# Patient Record
Sex: Male | Born: 1970 | Hispanic: Yes | Smoking: Former smoker
Health system: Southern US, Community
[De-identification: ages and names within clinical notes are randomized; demographics above are authoritative.]

## PROBLEM LIST (undated history)

## (undated) DIAGNOSIS — I10 Essential (primary) hypertension: Secondary | ICD-10-CM

## (undated) DIAGNOSIS — G43909 Migraine, unspecified, not intractable, without status migrainosus: Secondary | ICD-10-CM

## (undated) DIAGNOSIS — E7800 Pure hypercholesterolemia, unspecified: Secondary | ICD-10-CM

## (undated) DIAGNOSIS — E119 Type 2 diabetes mellitus without complications: Secondary | ICD-10-CM

## (undated) HISTORY — PX: APPENDECTOMY: SHX54

---

## 1988-07-26 HISTORY — PX: OTHER SURGICAL HISTORY: SHX169

## 1998-07-26 HISTORY — PX: OTHER SURGICAL HISTORY: SHX169

## 2019-01-02 ENCOUNTER — Other Ambulatory Visit: Payer: Self-pay

## 2019-01-02 ENCOUNTER — Emergency Department (HOSPITAL_COMMUNITY)
Admission: EM | Admit: 2019-01-02 | Discharge: 2019-01-02 | Disposition: A | Discharge status: Home / Self Care | Payer: Medicaid Other | Attending: Emergency Medicine | Admitting: Emergency Medicine

## 2019-01-02 ENCOUNTER — Emergency Department (HOSPITAL_COMMUNITY): Payer: Medicaid Other

## 2019-01-02 DIAGNOSIS — M25571 Pain in right ankle and joints of right foot: Secondary | ICD-10-CM | POA: Diagnosis not present

## 2019-01-02 DIAGNOSIS — Y939 Activity, unspecified: Secondary | ICD-10-CM | POA: Diagnosis not present

## 2019-01-02 DIAGNOSIS — S0990XA Unspecified injury of head, initial encounter: Secondary | ICD-10-CM | POA: Diagnosis not present

## 2019-01-02 DIAGNOSIS — W1830XA Fall on same level, unspecified, initial encounter: Secondary | ICD-10-CM | POA: Diagnosis not present

## 2019-01-02 DIAGNOSIS — Y999 Unspecified external cause status: Secondary | ICD-10-CM | POA: Diagnosis not present

## 2019-01-02 DIAGNOSIS — R51 Headache: Secondary | ICD-10-CM | POA: Diagnosis present

## 2019-01-02 DIAGNOSIS — R42 Dizziness and giddiness: Secondary | ICD-10-CM | POA: Diagnosis not present

## 2019-01-02 DIAGNOSIS — Y92009 Unspecified place in unspecified non-institutional (private) residence as the place of occurrence of the external cause: Secondary | ICD-10-CM | POA: Diagnosis not present

## 2019-01-02 LAB — BASIC METABOLIC PANEL
Anion gap: 8 (ref 5–15)
BUN: 22 mg/dL — ABNORMAL HIGH (ref 6–20)
CO2: 22 mmol/L (ref 22–32)
Calcium: 9.5 mg/dL (ref 8.9–10.3)
Chloride: 108 mmol/L (ref 98–111)
Creatinine, Ser: 0.9 mg/dL (ref 0.61–1.24)
GFR calc Af Amer: >60 mL/min (ref >60)
GFR calc non Af Amer: >60 mL/min (ref >60)
Glucose, Bld: 173 mg/dL — ABNORMAL HIGH (ref 70–99)
Potassium: 4.1 mmol/L (ref 3.5–5.1)
Sodium: 138 mmol/L (ref 135–145)

## 2019-01-02 LAB — CBC
HCT: 42.1 % (ref 39.0–52.0)
Hemoglobin: 13.8 g/dL (ref 13.0–17.0)
MCH: 29.6 pg (ref 26.0–34.0)
MCHC: 32.8 g/dL (ref 30.0–36.0)
MCV: 90.1 fL (ref 80.0–100.0)
Platelets: 264 10*3/uL (ref 150–400)
RBC: 4.67 MIL/uL (ref 4.22–5.81)
RDW: 13.3 % (ref 11.5–15.5)
WBC: 7.6 10*3/uL (ref 4.0–10.5)
nRBC: 0 % (ref 0.0–0.2)

## 2019-01-02 MED ORDER — ACETAMINOPHEN 500 MG PO TABS
1000.0000 mg | ORAL_TABLET | Freq: Once | ORAL | Status: AC
  Administered 2019-01-02: 1000 mg via ORAL
  Filled 2019-01-02: qty 2

## 2019-01-02 MED ORDER — SODIUM CHLORIDE 0.9% FLUSH: 3.0000 mL | Freq: Once | INTRAVENOUS | Status: DC

## 2019-01-02 NOTE — ED Triage Notes (Signed)
Pt endorses having mechanical fall last night and has had dizziness, headache and right ankle pain since then. Pt indention in head that is normal. VSS.

## 2019-01-02 NOTE — ED Notes (Signed)
Patient verbalizes understanding of discharge instructions. Opportunity for questioning and answers were provided. Armband removed by staff, pt discharged from ED ambulatory to home.  

## 2019-01-02 NOTE — ED Provider Notes (Signed)
[facility] Note   CSN: [id number] Arrival date & time: 01/02/19  1227    History   Chief Complaint Chief Complaint  Patient presents with  . Fall  . Ankle Pain  . Dizziness    HPI Jackolyn ConferJuan M Leisner is a 48 y.o. male.     HPI 48 year old male presents the emergency department complaints of headache and dizziness after a fall last night.  He reports tripping and falling while at home injuring his right ankle and striking his head.  He denies nausea or vomiting.  No weakness of his arms or legs.  No neck pain.  Denies chest pain or shortness of breath.  Reports pain with ambulation on his right lateral ankle.  Still able to walk.  Symptoms are moderate in severity.   No past medical history on file.  There are no active problems to display for this patient.   * The histories are not reviewed yet. Please review them in the "History" navigator section and refresh this SmartLink.      Home Medications    Prior to Admission medications   Not on File    Family History No family history on file.  Social History Social History   Tobacco Use  . Smoking status: Not on file  Substance Use Topics  . Alcohol use: Not on file  . Drug use: Not on file     Allergies   Patient has no known allergies.   Review of Systems Review of Systems  All other systems reviewed and are negative.    Physical Exam Updated Vital Signs BP (!) 125/94   Pulse (!) 58   Temp 98 F (36.7 C) (Oral)   Resp 14   SpO2 100%   Physical Exam Vitals signs and nursing note reviewed.  Constitutional:      Appearance: He is well-developed.  HENT:     Head: Normocephalic and atraumatic.  Neck:     Musculoskeletal: Normal range of motion.  Cardiovascular:     Rate and Rhythm: Normal rate and regular rhythm.     Heart sounds: Normal heart sounds.  Pulmonary:     Effort: Pulmonary effort is normal. No respiratory distress.  Abdominal:   General: There is no distension.     Palpations: Abdomen is soft.     Tenderness: There is no abdominal tenderness.  Musculoskeletal: Normal range of motion.     Comments: Mild pain with range of motion of his right ankle without obvious deformity.  Full range of motion of bilateral hips and bilateral knees.  Skin:    General: Skin is warm and dry.  Neurological:     Mental Status: He is alert and oriented to person, place, and time.  Psychiatric:        Judgment: Judgment normal.      ED Treatments / Results  Labs (all labs ordered are listed, but only abnormal results are displayed) Labs Reviewed  BASIC METABOLIC PANEL - Abnormal; Notable for the following components:      Result Value   Glucose, Bld 173 (*)    BUN 22 (*)    All other components within normal limits  CBC    EKG EKG Interpretation  Date/Time:  Tuesday January 02 2019 12:44:49 EDT Ventricular Rate:  73 PR Interval:  158 QRS Duration: 144 QT Interval:  398 QTC Calculation: 438 R Axis:   -90 Text Interpretation:  Normal sinus rhythm Right bundle branch block Abnormal ECG  No old tracing to compare Confirmed by Jola Schmidt [phone]) on 01/02/2019 2:41:11 PM   Radiology Dg Ankle Complete Right  Result Date: 01/02/2019 CLINICAL DATA:  Pain following fall EXAM: RIGHT ANKLE - COMPLETE 3+ VIEW COMPARISON:  None. FINDINGS: Frontal, oblique, and lateral views obtained. There is soft tissue swelling laterally. There is no demonstrable fracture or joint effusion. Joint spaces appear unremarkable. No erosive change. Ankle mortise appears intact. There is calcification posterior tibial artery. IMPRESSION: No fracture or arthropathy. Ankle mortise appears intact. There is posterior tibial artery atherosclerotic calcification. Electronically Signed   By: Lowella Grip III M.D.   On: 01/02/2019 13:40   Ct Head Wo Contrast  Result Date: 01/02/2019 CLINICAL DATA:  Fall last night.  Headache and dizziness EXAM: CT HEAD WITHOUT  CONTRAST TECHNIQUE: Contiguous axial images were obtained from the base of the skull through the vertex without intravenous contrast. COMPARISON:  None. FINDINGS: Brain: Mild atrophy for age. Negative for hydrocephalus. Negative for acute infarct, hemorrhage, or mass. Vascular: Negative for hyperdense vessel Skull: Considerable bony thickening in the frontal bone bilaterally anteriorly with associated loss of overlying soft tissues. This is most likely postsurgical. Sinuses/Orbits: Negative Other: None IMPRESSION: Atrophy for age.  No acute intracranial abnormality Presumed postsurgical changes anterior frontal bone and scalp. Electronically Signed   By: Franchot Gallo M.D.   On: 01/02/2019 14:17    Procedures Procedures (including critical care time)  Medications Ordered in ED Medications  sodium chloride flush (NS) 0.9 % injection 3 mL (3 mLs Intravenous Not Given 01/02/19 1312)  acetaminophen (TYLENOL) tablet 1,000 mg (1,000 mg Oral Given 01/02/19 1350)     Initial Impression / Assessment and Plan / ED Course  I have reviewed the triage vital signs and the nursing notes.  Pertinent labs & imaging results that were available during my care of the patient were reviewed by me and considered in my medical decision making (see chart for details).        Patient is overall well-appearing.  Imaging here in the emergency department without significant abnormality.  No traumatic findings.  Primary care follow-up.  Home with instructions for ibuprofen and Tylenol for symptom control.  Discharged home in good condition  Final Clinical Impressions(s) / ED Diagnoses   Final diagnoses:  Traumatic injury of head, initial encounter  Acute right ankle pain    ED Discharge Orders    None       Jola Schmidt, MD 01/06/19 1213

## 2019-01-02 NOTE — ED Notes (Signed)
Patient transported to X-ray 

## 2019-01-31 ENCOUNTER — Encounter (HOSPITAL_COMMUNITY): Payer: Self-pay | Admitting: Emergency Medicine

## 2019-01-31 ENCOUNTER — Emergency Department (HOSPITAL_COMMUNITY)
Admission: EM | Admit: 2019-01-31 | Discharge: 2019-01-31 | Disposition: A | Discharge status: Home / Self Care | Payer: Medicaid Other | Attending: Emergency Medicine | Admitting: Emergency Medicine

## 2019-01-31 ENCOUNTER — Other Ambulatory Visit: Payer: Self-pay

## 2019-01-31 DIAGNOSIS — L918 Other hypertrophic disorders of the skin: Secondary | ICD-10-CM | POA: Insufficient documentation

## 2019-01-31 DIAGNOSIS — R222 Localized swelling, mass and lump, trunk: Secondary | ICD-10-CM | POA: Diagnosis present

## 2019-01-31 NOTE — ED Notes (Signed)
Patient verbalizes understanding of discharge instructions . Opportunity for questions and answers were provided . Armband removed by staff ,Pt discharged from ED. W/C  offered at D/C  and Declined W/C at D/C and was escorted to lobby by RN.  

## 2019-01-31 NOTE — Discharge Instructions (Addendum)
Please read attached information. If you experience any new or worsening signs or symptoms please return to the emergency room for evaluation. Please follow-up with your primary care provider or specialist as discussed.  °

## 2019-01-31 NOTE — ED Provider Notes (Signed)
[place] EMERGENCY DEPARTMENT Provider Note   CSN: [id number] Arrival date & time: 01/31/19  1304    History   Chief Complaint Chief Complaint  Patient presents with  . Abscess    HPI Matthew Fowler is a 48 y.o. male.     HPI   48 year old male presents today with a bump to his perineum.  He notes 2 weeks of irritation.  Notes he tried Band-Aids that are supposed to help remove these.  He denies any fever.  No history of the same.  History reviewed. No pertinent past medical history.  There are no active problems to display for this patient.      Home Medications    Prior to Admission medications   Not on File    Family History No family history on file.  Social History Social History   Tobacco Use  . Smoking status: Not on file  Substance Use Topics  . Alcohol use: Not on file  . Drug use: Not on file     Allergies   Patient has no known allergies.   Review of Systems Review of Systems  All other systems reviewed and are negative.    Physical Exam Updated Vital Signs BP (!) 140/92 (BP Location: Right Arm)   Pulse 70   Temp 98.4 F (36.9 C) (Oral)   Resp 20   SpO2 100%   Physical Exam Vitals signs and nursing note reviewed.  Constitutional:      Appearance: He is well-developed.  HENT:     Head: Normocephalic and atraumatic.  Eyes:     General: No scleral icterus.       Right eye: No discharge.        Left eye: No discharge.     Conjunctiva/sclera: Conjunctivae normal.     Pupils: Pupils are equal, round, and reactive to light.  Neck:     Musculoskeletal: Normal range of motion.     Vascular: No JVD.     Trachea: No tracheal deviation.  Pulmonary:     Effort: Pulmonary effort is normal.     Breath sounds: No stridor.  Skin:    Comments: 2 mm skin tag noted to the perineum no surrounding redness  Neurological:     Mental Status: He is alert and oriented to person, place, and time.     Coordination:  Coordination normal.  Psychiatric:        Behavior: Behavior normal.        Thought Content: Thought content normal.        Judgment: Judgment normal.      ED Treatments / Results  Labs (all labs ordered are listed, but only abnormal results are displayed) Labs Reviewed - No data to display  EKG None  Radiology No results found.  Procedures Procedures (including critical care time)  Medications Ordered in ED Medications - No data to display   Initial Impression / Assessment and Plan / ED Course  I have reviewed the triage vital signs and the nursing notes.  Pertinent labs & imaging results that were available during my care of the patient were reviewed by me and considered in my medical decision making (see chart for details).        48 year old male here with skin tag.  He is referred for outpatient management.  Final Clinical Impressions(s) / ED Diagnoses   Final diagnoses:  Skin tag    ED Discharge Orders    None  Eyvonne MechanicHedges, Montray Kliebert, PA-C 01/31/19 1420    Alvira MondaySchlossman, Erin, MD 02/02/19 Corky Crafts1957

## 2019-01-31 NOTE — ED Triage Notes (Signed)
Pt states for 2 weeks he has had a boil in between his rectum and scrotum.

## 2019-02-01 ENCOUNTER — Ambulatory Visit: Payer: Medicaid Other | Attending: Family Medicine | Admitting: Physician Assistant

## 2019-02-01 ENCOUNTER — Other Ambulatory Visit: Payer: Self-pay

## 2019-02-01 NOTE — Progress Notes (Signed)
Patient ID: RANKIN COOLMAN, male   DOB: 1971/07/14, 48 y.o.   MRN: [id number]  Virtual Visit via Telephone Note  I connected with Abbey Chatters on 02/01/19 at  2:30 PM EDT by telephone and verified that I am speaking with the correct person using two identifiers.   I discussed the limitations, risks, security and privacy concerns of performing an evaluation and management service by telephone and the availability of in person appointments. I also discussed with the patient that there may be a patient responsible charge related to this service. The patient expressed understanding and agreed to proceed.  Patient location: My Location:  [place] office Persons on the call:  History of Present Illness:   Was seen in ED yesterday for a skin tag of the perineum.  He had also been seen in the ED 01/02/2019 after sustaining a fall injuring his ankle and hitting his head.  Imaging was unremarkable and there has been no sequlae.    Observations/Objective:   Assessment and Plan:   Follow Up Instructions:    I discussed the assessment and treatment plan with the patient. The patient was provided an opportunity to ask questions and all were answered. The patient agreed with the plan and demonstrated an understanding of the instructions.   The patient was advised to call back or seek an in-person evaluation if the symptoms worsen or if the condition fails to improve as anticipated.  I provided *** minutes of non-face-to-face time during this encounter.   Freeman Caldron, PA-C

## 2019-03-08 ENCOUNTER — Other Ambulatory Visit: Payer: Self-pay

## 2019-03-08 ENCOUNTER — Ambulatory Visit (INDEPENDENT_AMBULATORY_CARE_PROVIDER_SITE_OTHER): Payer: Medicaid Other

## 2019-03-08 ENCOUNTER — Ambulatory Visit (HOSPITAL_COMMUNITY)
Admission: EM | Admit: 2019-03-08 | Discharge: 2019-03-08 | Disposition: A | Discharge status: Home / Self Care | Payer: Medicaid Other | Attending: Family Medicine | Admitting: Family Medicine

## 2019-03-08 ENCOUNTER — Encounter (HOSPITAL_COMMUNITY): Payer: Self-pay | Admitting: Emergency Medicine

## 2019-03-08 DIAGNOSIS — E119 Type 2 diabetes mellitus without complications: Secondary | ICD-10-CM | POA: Diagnosis not present

## 2019-03-08 DIAGNOSIS — I10 Essential (primary) hypertension: Secondary | ICD-10-CM | POA: Insufficient documentation

## 2019-03-08 DIAGNOSIS — R0602 Shortness of breath: Secondary | ICD-10-CM | POA: Insufficient documentation

## 2019-03-08 DIAGNOSIS — Z20828 Contact with and (suspected) exposure to other viral communicable diseases: Secondary | ICD-10-CM | POA: Diagnosis not present

## 2019-03-08 DIAGNOSIS — R6889 Other general symptoms and signs: Secondary | ICD-10-CM

## 2019-03-08 DIAGNOSIS — F17210 Nicotine dependence, cigarettes, uncomplicated: Secondary | ICD-10-CM | POA: Insufficient documentation

## 2019-03-08 DIAGNOSIS — M546 Pain in thoracic spine: Secondary | ICD-10-CM

## 2019-03-08 DIAGNOSIS — Z20822 Contact with and (suspected) exposure to covid-19: Secondary | ICD-10-CM

## 2019-03-08 HISTORY — DX: Type 2 diabetes mellitus without complications: E11.9

## 2019-03-08 HISTORY — DX: Essential (primary) hypertension: I10

## 2019-03-08 HISTORY — DX: Migraine, unspecified, not intractable, without status migrainosus: G43.909

## 2019-03-08 HISTORY — DX: Pure hypercholesterolemia, unspecified: E78.00

## 2019-03-08 MED ORDER — TIZANIDINE HCL 4 MG PO TABS: 4.0000 mg | ORAL_TABLET | Freq: Four times a day (QID) | ORAL | 0 refills | Status: DC | PRN

## 2019-03-08 NOTE — Discharge Instructions (Addendum)
With COVID testing, you must quarantine at home until test result is available Take Tylenol for pain and fever Chest x ray is normal I am prescribing a mild muscle relaxer for the back/shoulder blade pain Call for problems or for changes in work note     Person Under Monitoring Name: Matthew Fowler  Location[place] [address] [place] [id number]   Infection Prevention Recommendations for Individuals Confirmed to have, or Being Evaluated for, 2019 Novel Coronavirus (COVID-19) Infection Who Receive Care at Home  Individuals who are confirmed to have, or are being evaluated for, COVID-19 should follow the prevention steps below until a healthcare provider or local or state health department says they can return to normal activities.  Stay home except to get medical care You should restrict activities outside your home, except for getting medical care. Do not go to work, school, or public areas, and do not use public transportation or taxis.  Call ahead before visiting your doctor Before your medical appointment, call the healthcare provider and tell them that you have, or are being evaluated for, COVID-19 infection. This will help the healthcare providers office take steps to keep other people from getting infected. Ask your healthcare provider to call the local or state health department.  Monitor your symptoms Seek prompt medical attention if your illness is worsening (e.g., difficulty breathing). Before going to your medical appointment, call the healthcare provider and tell them that you have, or are being evaluated for, COVID-19 infection. Ask your healthcare provider to call the local or state health department.  Wear a facemask You should wear a facemask that covers your nose and mouth when you are in the same room with other people and when you visit a healthcare provider. People who live with or visit you should also wear a facemask while they are in the same room with  you.  Separate yourself from other people in your home As much as possible, you should stay in a different room from other people in your home. Also, you should use a separate bathroom, if available.  Avoid sharing household items You should not share dishes, drinking glasses, cups, eating utensils, towels, bedding, or other items with other people in your home. After using these items, you should wash them thoroughly with soap and water.  Cover your coughs and sneezes Cover your mouth and nose with a tissue when you cough or sneeze, or you can cough or sneeze into your sleeve. Throw used tissues in a lined trash can, and immediately wash your hands with soap and water for at least 20 seconds or use an alcohol-based hand rub.  Wash your [employer]hands Wash your hands often and thoroughly with soap and water for at least 20 seconds. You can use an alcohol-based hand sanitizer if soap and water are not available and if your hands are not visibly dirty. Avoid touching your eyes, nose, and mouth with unwashed hands.   Prevention Steps for Caregivers and Household Members of Individuals Confirmed to have, or Being Evaluated for, COVID-19 Infection Being Cared for in the Home  If you live with, or provide care at home for, a person confirmed to have, or being evaluated for, COVID-19 infection please follow these guidelines to prevent infection:  Follow healthcare providers instructions Make sure that you understand and can help the patient follow any healthcare provider instructions for all care.  Provide for the patients basic needs You should help the patient with basic needs in the home and  provide support for getting groceries, prescriptions, and other personal needs.  Monitor the patients symptoms If they are getting sicker, call his or her medical provider and tell them that the patient has, or is being evaluated for, COVID-19 infection. This will help the healthcare providers office  take steps to keep other people from getting infected. Ask the healthcare provider to call the local or state health department.  Limit the number of people who have contact with the patient If possible, have only one caregiver for the patient. Other household members should stay in another home or place of residence. If this is not possible, they should stay in another room, or be separated from the patient as much as possible. Use a separate bathroom, if available. Restrict visitors who do not have an essential need to be in the home.  Keep older adults, very young children, and other sick people away from the patient Keep older adults, very young children, and those who have compromised immune systems or chronic health conditions away from the patient. This includes people with chronic heart, lung, or kidney conditions, diabetes, and cancer.  Ensure good ventilation Make sure that shared spaces in the home have good air flow, such as from an air conditioner or an opened window, weather permitting.  Wash your hands often Wash your hands often and thoroughly with soap and water for at least 20 seconds. You can use an alcohol based hand sanitizer if soap and water are not available and if your hands are not visibly dirty. Avoid touching your eyes, nose, and mouth with unwashed hands. Use disposable paper towels to dry your hands. If not available, use dedicated cloth towels and replace them when they become wet.  Wear a facemask and gloves Wear a disposable facemask at all times in the room and gloves when you touch or have contact with the patients blood, body fluids, and/or secretions or excretions, such as sweat, saliva, sputum, nasal mucus, vomit, urine, or feces.  Ensure the mask fits over your nose and mouth tightly, and do not touch it during use. Throw out disposable facemasks and gloves after using them. Do not reuse. Wash your hands immediately after removing your facemask and  gloves. If your personal clothing becomes contaminated, carefully remove clothing and launder. Wash your hands after handling contaminated clothing. Place all used disposable facemasks, gloves, and other waste in a lined container before disposing them with other household waste. Remove gloves and wash your hands immediately after handling these items.  Do not share dishes, glasses, or other household items with the patient Avoid sharing household items. You should not share dishes, drinking glasses, cups, eating utensils, towels, bedding, or other items with a patient who is confirmed to have, or being evaluated for, COVID-19 infection. After the person uses these items, you should wash them thoroughly with soap and water.  Wash laundry thoroughly Immediately remove and wash clothes or bedding that have blood, body fluids, and/or secretions or excretions, such as sweat, saliva, sputum, nasal mucus, vomit, urine, or feces, on them. Wear gloves when handling laundry from the patient. Read and follow directions on labels of laundry or clothing items and detergent. In general, wash and dry with the warmest temperatures recommended on the label.  Clean all areas the individual has used often Clean all touchable surfaces, such as counters, tabletops, doorknobs, bathroom fixtures, toilets, phones, keyboards, tablets, and bedside tables, every day. Also, clean any surfaces that may have blood, body fluids, and/or secretions or  excretions on them. Wear gloves when cleaning surfaces the patient has come in contact with. Use a diluted bleach solution (e.g., dilute bleach with 1 part bleach and 10 parts water) or a household disinfectant with a label that says EPA-registered for coronaviruses. To make a bleach solution at home, add 1 tablespoon of bleach to 1 quart (4 cups) of water. For a larger supply, add  cup of bleach to 1 gallon (16 cups) of water. Read labels of cleaning products and follow  recommendations provided on product labels. Labels contain instructions for safe and effective use of the cleaning product including precautions you should take when applying the product, such as wearing gloves or eye protection and making sure you have good ventilation during use of the product. Remove gloves and wash hands immediately after cleaning.  Monitor yourself for signs and symptoms of illness Caregivers and household members are considered close contacts, should monitor their health, and will be asked to limit movement outside of the home to the extent possible. Follow the monitoring steps for close contacts listed on the symptom monitoring form.   ? If you have additional questions, contact your local health department or call the epidemiologist on call at [phone] (available 24/7). ? This guidance is subject to change. For the most up-to-date guidance from [facility], please refer to their website: YouBlogs.pl

## 2019-03-08 NOTE — ED Triage Notes (Signed)
Pt reports SOB with exertion x3 weeks.  He reports new onset left mid back pain at the base of his left lung that started yesterday.  He denies any injury to the back.  He also denies fever or cough.

## 2019-03-08 NOTE — ED Provider Notes (Signed)
[facility]    CSN: [id number] Arrival date & time: 03/08/19  3235      History   Chief Complaint Chief Complaint  Patient presents with   Shortness of Breath   Back Pain    HPI Matthew Fowler is a 48 y.o. male.   HPI  Patient is here for 2 problems.  First he has pain in his back just below the left shoulder blade.  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  He thinks he might of pulled a muscle. His other problem shortness of breath.  He states is been going on for about 3 weeks.  He just feels more tired and short of breath with exertion.  No chest pain.  No cough.  No sputum.  No fever or chills.  No change in taste or smell.  No known exposure to anyone with coronavirus.  No nausea vomiting diarrhea.  No underlying lung disease.  He is not a smoker.  No history of asthma.  No fluid overload or swelling of the ankles.  No history of heart disease.  He feels like his exercise tolerance is not as good as it was a month ago.  He states that he does have a primary care doctor.  Past Medical History:  Diagnosis Date   Diabetes mellitus without complication (HCC)    High cholesterol    Hypertension    Migraine     There are no active problems to display for this patient.   History reviewed. No pertinent surgical history.     Home Medications    Prior to Admission medications   Medication Sig Start Date End Date Taking? Authorizing Provider  tiZANidine (ZANAFLEX) 4 MG tablet Take 1-2 tablets (4-8 mg total) by mouth every 6 (six) hours as needed for muscle spasms. 03/08/19   Eustace Moore, MD    Family History Family History  Problem Relation Age of Onset   Cancer Mother     Social History Social History   Tobacco Use   Smoking status: Current Some Day Smoker    Types: Cigarettes   Smokeless tobacco: Never Used  Substance Use Topics   Alcohol use: Yes   Drug use: Never     Allergies   Patient has no known allergies.   Review  of Systems Review of Systems  Constitutional: Negative for chills and fever.  HENT: Negative for ear pain and sore throat.   Eyes: Negative for pain and visual disturbance.  Respiratory: Positive for shortness of breath. Negative for cough.   Cardiovascular: Negative for chest pain and palpitations.  Gastrointestinal: Negative for abdominal pain and vomiting.  Genitourinary: Negative for dysuria and hematuria.  Musculoskeletal: Positive for back pain. Negative for arthralgias.  Skin: Negative for color change and rash.  Neurological: Negative for seizures and syncope.  All other systems reviewed and are negative.    Physical Exam Triage Vital Signs ED Triage Vitals  Enc Vitals Group     BP 03/08/19 1010 128/89     Pulse Rate 03/08/19 1010 66     Resp 03/08/19 1010 18     Temp 03/08/19 1010 97.7 F (36.5 C)     Temp Source 03/08/19 1010 Oral     SpO2 03/08/19 1010 97 %     Weight --      Height --      Head Circumference --      Peak Flow --      Pain Score  03/08/19 1004 6     Pain Loc --      Pain Edu? --      Excl. in GC? --    No data found.  Updated Vital Signs BP 128/89 (BP Location: Right Arm)    Pulse 66    Temp 97.7 F (36.5 C) (Oral)    Resp 18    SpO2 97%     Physical Exam Constitutional:      General: He is not in acute distress.    Appearance: He is well-developed.  HENT:     Head: Normocephalic and atraumatic.  Eyes:     Conjunctiva/sclera: Conjunctivae normal.     Pupils: Pupils are equal, round, and reactive to light.  Neck:     Musculoskeletal: Normal range of motion.  Cardiovascular:     Rate and Rhythm: Normal rate and regular rhythm.     Heart sounds: Normal heart sounds.  Pulmonary:     Effort: Pulmonary effort is normal. No respiratory distress.     Breath sounds: Normal breath sounds. No decreased breath sounds, wheezing, rhonchi or rales.  Chest:     Chest wall: No tenderness.  Abdominal:     General: Bowel sounds are normal. There  is no distension.     Palpations: Abdomen is soft.  Musculoskeletal: Normal range of motion.     Comments: Tenderness at the lower border of the left scapula and in the rhomboid region.  No palpable muscle spasm  Skin:    General: Skin is warm and dry.  Neurological:     Mental Status: He is alert.  Psychiatric:        Mood and Affect: Mood normal.        Behavior: Behavior normal.      UC Treatments / Results  Labs (all labs ordered are listed, but only abnormal results are displayed) Labs Reviewed  NOVEL CORONAVIRUS, NAA (HOSPITAL ORDER, SEND-OUT TO REF LAB)    EKG   Radiology Dg Chest 2 View  Result Date: 03/08/2019 CLINICAL DATA:  Shortness of breath and cough EXAM: CHEST - 2 VIEW COMPARISON:  07/14/2018 FINDINGS: The heart size and mediastinal contours are within normal limits. Both lungs are clear. The visualized skeletal structures are unremarkable. IMPRESSION: No acute abnormality noted. Electronically Signed   By: Alcide CleverMark  Lukens M.D.   On: 03/08/2019 11:01    Procedures Procedures (including critical care time)  Medications Ordered in UC Medications - No data to display  Initial Impression / Assessment and Plan / UC Course  I have reviewed the triage vital signs and the nursing notes.  Pertinent labs & imaging results that were available during my care of the patient were reviewed by me and considered in my medical decision making (see chart for details).  Clinical Course as of Mar 07 1836  Thu Mar 08, 2019  1104 DG Chest 2 View [YN]    Clinical Course User Index [YN] Eustace MooreNelson, Gilberta Peeters Sue, MD    I recommend coronavirus testing.  Symptoms are atypical but possible.  We did talk about the importance of quarantine until the test results are in. Chest x-ray is normal.  No evidence of pneumonia.  Needs to follow-up with his PCP above with regard to the shortness of breath Back pain is muscular. Final Clinical Impressions(s) / UC Diagnoses   Final diagnoses:    Shortness of breath  Acute left-sided thoracic back pain  Suspected Covid-19 Virus Infection     Discharge Instructions  With COVID testing, you must quarantine at home until test result is available Take Tylenol for pain and fever Chest x ray is normal I am prescribing a mild muscle relaxer for the back/shoulder blade pain Call for problems or for changes in work note     Person Under Monitoring Name: KAEL KEETCH  Location: [place] [address]   Infection Prevention Recommendations for Individuals Confirmed to have, or Being Evaluated for, 2019 Novel Coronavirus (COVID-19) Infection Who Receive Care at Home  Individuals who are confirmed to have, or are being evaluated for, COVID-19 should follow the prevention steps below until a healthcare provider or local or state health department says they can return to normal activities.  Stay home except to get medical care You should restrict activities outside your home, except for getting medical care. Do not go to work, school, or public areas, and do not use public transportation or taxis.  Call ahead before visiting your doctor Before your medical appointment, call the healthcare provider and tell them that you have, or are being evaluated for, COVID-19 infection. This will help the healthcare providers office take steps to keep other people from getting infected. Ask your healthcare provider to call the local or state health department.  Monitor your symptoms Seek prompt medical attention if your illness is worsening (e.g., difficulty breathing). Before going to your medical appointment, call the healthcare provider and tell them that you have, or are being evaluated for, COVID-19 infection. Ask your healthcare provider to call the local or state health department.  Wear a facemask You should wear a facemask that covers your nose and mouth when you are in the same room with other people and when you  visit a healthcare provider. People who live with or visit you should also wear a facemask while they are in the same room with you.  Separate yourself from other people in your home As much as possible, you should stay in a different room from other people in your home. Also, you should use a separate bathroom, if available.  Avoid sharing household items You should not share dishes, drinking glasses, cups, eating utensils, towels, bedding, or other items with other people in your home. After using these items, you should wash them thoroughly with soap and water.  Cover your coughs and sneezes Cover your mouth and nose with a tissue when you cough or sneeze, or you can cough or sneeze into your sleeve. Throw used tissues in a lined trash can, and immediately wash your hands with soap and water for at least 20 seconds or use an alcohol-based hand rub.  Wash your [facility] your hands often and thoroughly with soap and water for at least 20 seconds. You can use an alcohol-based hand sanitizer if soap and water are not available and if your hands are not visibly dirty. Avoid touching your eyes, nose, and mouth with unwashed hands.   Prevention Steps for Caregivers and Household Members of Individuals Confirmed to have, or Being Evaluated for, COVID-19 Infection Being Cared for in the Home  If you live with, or provide care at home for, a person confirmed to have, or being evaluated for, COVID-19 infection please follow these guidelines to prevent infection:  Follow healthcare providers instructions Make sure that you understand and can help the patient follow any healthcare provider instructions for all care.  Provide for the patients basic needs You should help the patient with basic needs in the home and  provide support for getting groceries, prescriptions, and other personal needs.  Monitor the patients symptoms If they are getting sicker, call his or her medical provider and  tell them that the patient has, or is being evaluated for, COVID-19 infection. This will help the healthcare providers office take steps to keep other people from getting infected. Ask the healthcare provider to call the local or state health department.  Limit the number of people who have contact with the patient  If possible, have only one caregiver for the patient.  Other household members should stay in another home or place of residence. If this is not possible, they should stay  in another room, or be separated from the patient as much as possible. Use a separate bathroom, if available.  Restrict visitors who do not have an essential need to be in the home.  Keep older adults, very young children, and other sick people away from the patient Keep older adults, very young children, and those who have compromised immune systems or chronic health conditions away from the patient. This includes people with chronic heart, lung, or kidney conditions, diabetes, and cancer.  Ensure good ventilation Make sure that shared spaces in the home have good air flow, such as from an air conditioner or an opened window, weather permitting.  Wash your hands often  Wash your hands often and thoroughly with soap and water for at least 20 seconds. You can use an alcohol based hand sanitizer if soap and water are not available and if your hands are not visibly dirty.  Avoid touching your eyes, nose, and mouth with unwashed hands.  Use disposable paper towels to dry your hands. If not available, use dedicated cloth towels and replace them when they become wet.  Wear a facemask and gloves  Wear a disposable facemask at all times in the room and gloves when you touch or have contact with the patients blood, body fluids, and/or secretions or excretions, such as sweat, saliva, sputum, nasal mucus, vomit, urine, or feces.  Ensure the mask fits over your nose and mouth tightly, and do not touch it during  use.  Throw out disposable facemasks and gloves after using them. Do not reuse.  Wash your hands immediately after removing your facemask and gloves.  If your personal clothing becomes contaminated, carefully remove clothing and launder. Wash your hands after handling contaminated clothing.  Place all used disposable facemasks, gloves, and other waste in a lined container before disposing them with other household waste.  Remove gloves and wash your hands immediately after handling these items.  Do not share dishes, glasses, or other household items with the patient  Avoid sharing household items. You should not share dishes, drinking glasses, cups, eating utensils, towels, bedding, or other items with a patient who is confirmed to have, or being evaluated for, COVID-19 infection.  After the person uses these items, you should wash them thoroughly with soap and water.  Wash laundry thoroughly  Immediately remove and wash clothes or bedding that have blood, body fluids, and/or secretions or excretions, such as sweat, saliva, sputum, nasal mucus, vomit, urine, or feces, on them.  Wear gloves when handling laundry from the patient.  Read and follow directions on labels of laundry or clothing items and detergent. In general, wash and dry with the warmest temperatures recommended on the label.  Clean all areas the individual has used often  Clean all touchable surfaces, such as counters, tabletops, doorknobs, bathroom fixtures, toilets, phones, keyboards,  tablets, and bedside tables, every day. Also, clean any surfaces that may have blood, body fluids, and/or secretions or excretions on them.  Wear gloves when cleaning surfaces the patient has come in contact with.  Use a diluted bleach solution (e.g., dilute bleach with 1 part bleach and 10 parts water) or a household disinfectant with a label that says EPA-registered for coronaviruses. To make a bleach solution at home, add 1 tablespoon  of bleach to 1 quart (4 cups) of water. For a larger supply, add  cup of bleach to 1 gallon (16 cups) of water.  Read labels of cleaning products and follow recommendations provided on product labels. Labels contain instructions for safe and effective use of the cleaning product including precautions you should take when applying the product, such as wearing gloves or eye protection and making sure you have good ventilation during use of the product.  Remove gloves and wash hands immediately after cleaning.  Monitor yourself for signs and symptoms of illness Caregivers and household members are considered close contacts, should monitor their health, and will be asked to limit movement outside of the home to the extent possible. Follow the monitoring steps for close contacts listed on the symptom monitoring form.   ? If you have additional questions, contact your local health department or call the epidemiologist on call at [phone] (available 24/7). ? This guidance is subject to change. For the most up-to-date guidance from [facility], please refer to their website: TripMetro.huhttps://www.cdc.gov/coronavirus/2019-ncov/hcp/guidance-prevent-spread.html     ED Prescriptions    Medication Sig Dispense Auth. Provider   tiZANidine (ZANAFLEX) 4 MG tablet Take 1-2 tablets (4-8 mg total) by mouth every 6 (six) hours as needed for muscle spasms. 21 tablet Eustace MooreNelson, Latashia Koch Sue, MD     Controlled Substance Prescriptions [place] Controlled Substance Registry consulted? Not Applicable   Eustace MooreNelson, Megham Dwyer Sue, MD 03/08/19 1840

## 2019-03-09 LAB — NOVEL CORONAVIRUS, NAA (HOSP ORDER, SEND-OUT TO REF LAB; TAT 18-24 HRS): SARS-CoV-2, NAA: NOT DETECTED

## 2019-04-18 ENCOUNTER — Encounter (HOSPITAL_COMMUNITY): Payer: Self-pay | Admitting: Emergency Medicine

## 2019-04-18 ENCOUNTER — Other Ambulatory Visit: Payer: Self-pay

## 2019-04-18 ENCOUNTER — Ambulatory Visit (HOSPITAL_COMMUNITY)
Admission: EM | Admit: 2019-04-18 | Discharge: 2019-04-18 | Disposition: A | Discharge status: Home / Self Care | Payer: Medicaid Other | Attending: Family Medicine | Admitting: Family Medicine

## 2019-04-18 DIAGNOSIS — G43009 Migraine without aura, not intractable, without status migrainosus: Secondary | ICD-10-CM

## 2019-04-18 MED ORDER — NAPROXEN 500 MG PO TABS: 500.0000 mg | ORAL_TABLET | Freq: Two times a day (BID) | ORAL | 0 refills | Status: DC

## 2019-04-18 MED ORDER — DEXAMETHASONE SODIUM PHOSPHATE 10 MG/ML IJ SOLN
INTRAMUSCULAR | Status: AC
  Filled 2019-04-18: qty 1

## 2019-04-18 MED ORDER — DEXAMETHASONE SODIUM PHOSPHATE 10 MG/ML IJ SOLN
10.0000 mg | Freq: Once | INTRAMUSCULAR | Status: AC
  Administered 2019-04-18: 13:00:00 10 mg via INTRAMUSCULAR
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
## 2019-04-18 NOTE — ED Notes (Signed)
Bed: UC06 Expected date:  Expected time:  Means of arrival:  Comments: Hold for Headache after registered

## 2019-04-18 NOTE — ED Triage Notes (Signed)
Headache and blurred vision for a week.  History of migraines .  Patient says he has noticed a change in vision for a week .

## 2019-04-18 NOTE — ED Notes (Signed)
Spoke to dr Joseph Art about patient

## 2019-04-18 NOTE — Discharge Instructions (Addendum)
Please follow up with eye doctor We gave you decadron to help with your headache Continue with naprosyn twice  daily as needed for further headache management  Please follow-up in emergency room if developing worsening headache, vision changes, difficulty speaking, facial drooping, weakness, chest pain or shortness of breath, dizziness or lightheadedness

## 2019-05-17 ENCOUNTER — Emergency Department (HOSPITAL_COMMUNITY): Payer: Medicaid Other

## 2019-05-17 ENCOUNTER — Emergency Department (HOSPITAL_COMMUNITY)
Admission: EM | Admit: 2019-05-17 | Discharge: 2019-05-18 | Disposition: A | Discharge status: Home / Self Care | Payer: Medicaid Other | Attending: Emergency Medicine | Admitting: Emergency Medicine

## 2019-05-17 ENCOUNTER — Encounter (HOSPITAL_COMMUNITY): Payer: Self-pay | Admitting: Emergency Medicine

## 2019-05-17 ENCOUNTER — Other Ambulatory Visit: Payer: Self-pay

## 2019-05-17 DIAGNOSIS — R0602 Shortness of breath: Secondary | ICD-10-CM | POA: Diagnosis not present

## 2019-05-17 DIAGNOSIS — R42 Dizziness and giddiness: Secondary | ICD-10-CM | POA: Diagnosis present

## 2019-05-17 DIAGNOSIS — I10 Essential (primary) hypertension: Secondary | ICD-10-CM | POA: Diagnosis not present

## 2019-05-17 DIAGNOSIS — E119 Type 2 diabetes mellitus without complications: Secondary | ICD-10-CM | POA: Insufficient documentation

## 2019-05-17 DIAGNOSIS — Z791 Long term (current) use of non-steroidal anti-inflammatories (NSAID): Secondary | ICD-10-CM | POA: Diagnosis not present

## 2019-05-17 DIAGNOSIS — F17210 Nicotine dependence, cigarettes, uncomplicated: Secondary | ICD-10-CM | POA: Insufficient documentation

## 2019-05-17 LAB — CBC
HCT: 44.1 % (ref 39.0–52.0)
Hemoglobin: 14.4 g/dL (ref 13.0–17.0)
MCH: 29.6 pg (ref 26.0–34.0)
MCHC: 32.7 g/dL (ref 30.0–36.0)
MCV: 90.6 fL (ref 80.0–100.0)
Platelets: 253 10*3/uL (ref 150–400)
RBC: 4.87 MIL/uL (ref 4.22–5.81)
RDW: 13.5 % (ref 11.5–15.5)
WBC: 8.5 10*3/uL (ref 4.0–10.5)
nRBC: 0 % (ref 0.0–0.2)

## 2019-05-17 LAB — TROPONIN I (HIGH SENSITIVITY): Troponin I (High Sensitivity): 4 ng/L (ref <18)

## 2019-05-17 LAB — BASIC METABOLIC PANEL
Anion gap: 10 (ref 5–15)
BUN: 23 mg/dL — ABNORMAL HIGH (ref 6–20)
CO2: 22 mmol/L (ref 22–32)
Calcium: 9.5 mg/dL (ref 8.9–10.3)
Chloride: 104 mmol/L (ref 98–111)
Creatinine, Ser: 0.89 mg/dL (ref 0.61–1.24)
GFR calc Af Amer: >60 mL/min (ref >60)
GFR calc non Af Amer: >60 mL/min (ref >60)
Glucose, Bld: 90 mg/dL (ref 70–99)
Potassium: 4.1 mmol/L (ref 3.5–5.1)
Sodium: 136 mmol/L (ref 135–145)

## 2019-05-17 LAB — CBG MONITORING, ED: Glucose-Capillary: 107 mg/dL — ABNORMAL HIGH (ref 70–99)

## 2019-05-17 MED ORDER — SODIUM CHLORIDE 0.9% FLUSH: 3.0000 mL | Freq: Once | INTRAVENOUS | Status: DC

## 2019-05-17 NOTE — Discharge Instructions (Signed)
Your laboratory results today were within normal limits.  I have placed an ambulatory referral to cardiology, please call cardiologist number attached to your chart.  If you experience any chest pain, shortness of breath please return to the emergency room.
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
## 2019-05-17 NOTE — ED Triage Notes (Signed)
Pt reports dizziness and a near syncopal episode. Pt reports feeling dizzy for the past 2 weeks and yesterday he felt like he was going to pass out.

## 2019-05-21 ENCOUNTER — Ambulatory Visit (INDEPENDENT_AMBULATORY_CARE_PROVIDER_SITE_OTHER): Payer: Medicaid Other | Admitting: Cardiology

## 2019-05-21 ENCOUNTER — Other Ambulatory Visit: Payer: Self-pay

## 2019-05-21 ENCOUNTER — Encounter: Payer: Self-pay | Admitting: Cardiology

## 2019-05-21 VITALS — BP 116/90 | HR 65 | Temp 95.4°F | Ht 68.0 in | Wt 189.0 lb

## 2019-05-21 DIAGNOSIS — R55 Syncope and collapse: Secondary | ICD-10-CM

## 2019-05-21 DIAGNOSIS — Z7189 Other specified counseling: Secondary | ICD-10-CM | POA: Diagnosis not present

## 2019-05-21 DIAGNOSIS — R0600 Dyspnea, unspecified: Secondary | ICD-10-CM | POA: Diagnosis not present

## 2019-05-21 DIAGNOSIS — R002 Palpitations: Secondary | ICD-10-CM

## 2019-05-21 DIAGNOSIS — R0609 Other forms of dyspnea: Secondary | ICD-10-CM
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
## 2019-05-21 NOTE — Patient Instructions (Signed)
Medication Instructions:  Your Physician recommend you continue on your current medication as directed.    *If you need a refill on your cardiac medications before your next appointment, please call your pharmacy*  Lab Work: None  Testing/Procedures: Our physician has recommended that you wear an [place] monitor. The Zio patch cardiac monitor continuously records heart rhythm data for up to 14 days, this is for patients being evaluated for multiple types heart rhythms. For the first 24 hours post application, please avoid getting the Zio monitor wet in the shower or by excessive sweating during exercise. After that, feel free to carry on with regular activities. Keep soaps and lotions away from the ZIO XT Patch.   Someone will call you to have monitor mailed     Your physician has requested that you have an echocardiogram. Echocardiography is a painless test that uses sound waves to create images of your heart. It provides your doctor with information about the size and shape of your heart and how well your heart's chambers and valves are working. This procedure takes approximately one hour. There are no restrictions for this procedure. [place] [address]   Follow-Up: At [facility], you and your health needs are our priority.  As part of our continuing mission to provide you with exceptional heart care, we have created designated Provider Care Teams.  These Care Teams include your primary Cardiologist (physician) and Advanced Practice Providers (APPs -  Physician Assistants and Nurse Practitioners) who all work together to provide you with the care you need, when you need it.  Your next appointment:   As needed  The format for your next appointment:   Either In Person or Virtual  Provider:   Buford Dresser, MD

## 2019-05-29 ENCOUNTER — Encounter (INDEPENDENT_AMBULATORY_CARE_PROVIDER_SITE_OTHER): Payer: Self-pay

## 2019-05-29 ENCOUNTER — Other Ambulatory Visit: Payer: Self-pay

## 2019-05-29 ENCOUNTER — Ambulatory Visit (HOSPITAL_COMMUNITY): Payer: Medicaid Other | Attending: Cardiology

## 2019-05-29 DIAGNOSIS — R55 Syncope and collapse: Secondary | ICD-10-CM | POA: Insufficient documentation

## 2019-05-29 DIAGNOSIS — R002 Palpitations: Secondary | ICD-10-CM | POA: Insufficient documentation

## 2019-05-31 ENCOUNTER — Telehealth: Payer: Self-pay

## 2019-05-31 NOTE — Telephone Encounter (Signed)
Spoke to pt, went over monitor instructions. Verified address. 7 day ZIO ordered.  

## 2019-06-07 ENCOUNTER — Ambulatory Visit (INDEPENDENT_AMBULATORY_CARE_PROVIDER_SITE_OTHER): Payer: Medicaid Other

## 2019-06-07 DIAGNOSIS — R55 Syncope and collapse: Secondary | ICD-10-CM

## 2019-06-07 DIAGNOSIS — R002 Palpitations: Secondary | ICD-10-CM | POA: Diagnosis not present

## 2019-09-04 ENCOUNTER — Ambulatory Visit (HOSPITAL_COMMUNITY)
Admission: EM | Admit: 2019-09-04 | Discharge: 2019-09-04 | Disposition: A | Discharge status: Home / Self Care | Payer: Medicaid Other | Attending: Family Medicine | Admitting: Family Medicine

## 2019-09-04 ENCOUNTER — Other Ambulatory Visit: Payer: Self-pay

## 2019-09-04 ENCOUNTER — Encounter (HOSPITAL_COMMUNITY): Payer: Self-pay | Admitting: Emergency Medicine

## 2019-09-04 DIAGNOSIS — E7800 Pure hypercholesterolemia, unspecified: Secondary | ICD-10-CM | POA: Diagnosis not present

## 2019-09-04 DIAGNOSIS — Z79899 Other long term (current) drug therapy: Secondary | ICD-10-CM | POA: Insufficient documentation

## 2019-09-04 DIAGNOSIS — Z20822 Contact with and (suspected) exposure to covid-19: Secondary | ICD-10-CM | POA: Insufficient documentation

## 2019-09-04 DIAGNOSIS — B349 Viral infection, unspecified: Secondary | ICD-10-CM

## 2019-09-04 DIAGNOSIS — Z87891 Personal history of nicotine dependence: Secondary | ICD-10-CM | POA: Insufficient documentation

## 2019-09-04 DIAGNOSIS — E119 Type 2 diabetes mellitus without complications: Secondary | ICD-10-CM | POA: Insufficient documentation

## 2019-09-04 DIAGNOSIS — J029 Acute pharyngitis, unspecified: Secondary | ICD-10-CM | POA: Diagnosis present

## 2019-09-04 DIAGNOSIS — I10 Essential (primary) hypertension: Secondary | ICD-10-CM | POA: Diagnosis not present

## 2019-09-04 LAB — POCT RAPID STREP A: Streptococcus, Group A Screen (Direct): NEGATIVE

## 2019-09-04 MED ORDER — FLUTICASONE PROPIONATE 50 MCG/ACT NA SUSP: 1.0000 | Freq: Every day | NASAL | 2 refills | Status: DC

## 2019-09-04 MED ORDER — CETIRIZINE HCL 10 MG PO TABS: 10.0000 mg | ORAL_TABLET | Freq: Every day | ORAL | 0 refills | Status: DC

## 2019-09-04 NOTE — Discharge Instructions (Addendum)
Your strep test was negative here.  We have sent off for Covid and will call you with any positive results. If your results are positive we can try to refer you to the outpatient infusion clinic for medication. For now we will do Flonase and Zyrtec for symptoms I have sent these to your pharmacy. If your symptoms worsen or you start having severe cough, chest pain or shortness of breath you need to return for reevaluation You can return in a couple days to work if your Covid test is negative.  If your Covid test is positive you need to wait 10 days from the time your symptoms started before you can return to work.  Go home and rest and quarantine for now.

## 2019-09-04 NOTE — ED Triage Notes (Signed)
Pt sts sore throat with body aches and runny nose x 3 days

## 2019-09-04 NOTE — ED Provider Notes (Signed)
[facility]    CSN: [id number] Arrival date & time: 09/04/19  1127      History   Chief Complaint Chief Complaint  Patient presents with  . Sore Throat  . Generalized Body Aches    HPI Matthew Fowler is a 49 y.o. male.   Patient is a 49 year old male with past medical history of diabetes, high cholesterol, hypertension, migraines.  He presents today with approximately 3 days of generalized body aches, sore throat, nasal congestion and rhinorrhea.  Symptoms have been constant.  He has not taken any medication for symptoms.  Denies any cough, chest congestion, chest pain or shortness of breath.  Denies any fevers, diarrhea, loss of taste or smell.  Has had positive Covid exposures at work.  ROS per HPI      Past Medical History:  Diagnosis Date  . Diabetes mellitus without complication (HCC)   . High cholesterol   . Hypertension   . Migraine     There are no problems to display for this patient.   History reviewed. No pertinent surgical history.     Home Medications    Prior to Admission medications   Medication Sig Start Date End Date Taking? Authorizing Provider  cetirizine (ZYRTEC) 10 MG tablet Take 1 tablet (10 mg total) by mouth daily. 09/04/19   Symeon Puleo, Gloris Manchester A, NP  fluticasone (FLONASE) 50 MCG/ACT nasal spray Place 1 spray into both nostrils daily. 09/04/19   Zakaria Fromer, Gloris Manchester A, NP  lisinopril (ZESTRIL) 10 MG tablet Take 10 mg by mouth daily. 11/20/18   [provider]  naproxen (NAPROSYN) 500 MG tablet Take 1 tablet (500 mg total) by mouth 2 (two) times daily. 04/18/19   Wieters, Hallie C, PA-C  rizatriptan (MAXALT) 10 MG tablet Take 1 tab at onset of headache. May repeat once in 24 hours if no relief after 2 hours. 11/08/18   [provider]  tiZANidine (ZANAFLEX) 4 MG tablet Take 1-2 tablets (4-8 mg total) by mouth every 6 (six) hours as needed for muscle spasms. 03/08/19   Eustace Moore, MD    Family History Family History  Problem  Relation Age of Onset  . Cancer Mother     Social History Social History   Tobacco Use  . Smoking status: Former Smoker    Types: Cigarettes  . Smokeless tobacco: Never Used  Substance Use Topics  . Alcohol use: Not Currently  . Drug use: Never     Allergies   Patient has no known allergies.   Review of Systems Review of Systems   Physical Exam Triage Vital Signs ED Triage Vitals [09/04/19 1157]  Enc Vitals Group     BP (!) 142/87     Pulse Rate 68     Resp 18     Temp 98.4 F (36.9 C)     Temp Source Oral     SpO2 96 %     Weight      Height      Head Circumference      Peak Flow      Pain Score 5     Pain Loc      Pain Edu?      Excl. in GC?    No data found.  Updated Vital Signs BP (!) 142/87 (BP Location: Right Arm)   Pulse 68   Temp 98.4 F (36.9 C) (Oral)   Resp 18   SpO2 96%   Visual Acuity Right Eye Distance:   Left  Eye Distance:   Bilateral Distance:    Right Eye Near:   Left Eye Near:    Bilateral Near:     Physical Exam Vitals and nursing note reviewed.  Constitutional:      General: He is not in acute distress.    Appearance: Normal appearance. He is not ill-appearing, toxic-appearing or diaphoretic.  HENT:     Head: Normocephalic and atraumatic.     Right Ear: Tympanic membrane and ear canal normal.     Left Ear: Tympanic membrane and ear canal normal.     Nose: Congestion and rhinorrhea present.     Mouth/Throat:     Pharynx: No posterior oropharyngeal erythema.     Comments: PND  Eyes:     Conjunctiva/sclera: Conjunctivae normal.  Cardiovascular:     Rate and Rhythm: Normal rate and regular rhythm.  Pulmonary:     Effort: Pulmonary effort is normal.     Breath sounds: Normal breath sounds.  Musculoskeletal:        General: Normal range of motion.     Cervical back: Normal range of motion.  Skin:    General: Skin is warm and dry.  Neurological:     Mental Status: He is alert.  Psychiatric:        Mood and  Affect: Mood normal.      UC Treatments / Results  Labs (all labs ordered are listed, but only abnormal results are displayed) Labs Reviewed  NOVEL CORONAVIRUS, NAA (HOSP ORDER, SEND-OUT TO REF LAB; TAT 18-24 HRS)  CULTURE, GROUP A STREP [facility])  POCT RAPID STREP A    EKG   Radiology No results found.  Procedures Procedures (including critical care time)  Medications Ordered in UC Medications - No data to display  Initial Impression / Assessment and Plan / UC Course  I have reviewed the triage vital signs and the nursing notes.  Pertinent labs & imaging results that were available during my care of the patient were reviewed by me and considered in my medical decision making (see chart for details).     Viral illness-rapid strep test negative here today.  Sending for culture. Covid swab sent for testing with labs pending.  Quarantine precautions given and work note given Treating symptoms with Flonase and Zyrtec. Reassured that if test comes back positive we will refer to the infusion clinic outpatient based on hx Otherwise if he starts developing worsening symptoms to include chest pain or shortness of breath he will need to be re seen or go to the ER.  Patient understanding and agree. Final Clinical Impressions(s) / UC Diagnoses   Final diagnoses:  Viral illness     Discharge Instructions     Your strep test was negative here.  We have sent off for Covid and will call you with any positive results. If your results are positive we can try to refer you to the outpatient infusion clinic for medication. For now we will do Flonase and Zyrtec for symptoms I have sent these to your pharmacy. If your symptoms worsen or you start having severe cough, chest pain or shortness of breath you need to return for reevaluation You can return in a couple days to work if your Covid test is negative.  If your Covid test is positive you need to wait 10 days from the time your symptoms  started before you can return to work.  Go home and rest and quarantine for now.      ED Prescriptions  Medication Sig Dispense Auth. Provider   fluticasone (FLONASE) 50 MCG/ACT nasal spray Place 1 spray into both nostrils daily. 16 g Ranen Doolin A, NP   cetirizine (ZYRTEC) 10 MG tablet Take 1 tablet (10 mg total) by mouth daily. 30 tablet Loura Halt A, NP     PDMP not reviewed this encounter.   Loura Halt A, NP 09/04/19 1259

## 2019-09-06 LAB — NOVEL CORONAVIRUS, NAA (HOSP ORDER, SEND-OUT TO REF LAB; TAT 18-24 HRS): SARS-CoV-2, NAA: NOT DETECTED

## 2019-09-06 LAB — CULTURE, GROUP A STREP (THRC)

## 2019-11-01 ENCOUNTER — Ambulatory Visit: Payer: Medicaid Other | Attending: Internal Medicine

## 2019-11-01 DIAGNOSIS — Z23 Encounter for immunization: Secondary | ICD-10-CM

## 2019-11-01 NOTE — Progress Notes (Signed)
   Covid-19 Vaccination Clinic  Name:  [name]    MRN: [id number] DOB: [birth year]  11/01/2019  [name] was observed post Covid-19 immunization for 15 minutes without incident. He was provided with Vaccine Information Sheet and instruction to access the V-Safe system.   [name] was instructed to call 911 with any severe reactions post vaccine: Marland Kitchen Difficulty breathing  . Swelling of face and throat  . A fast heartbeat  . A bad rash all over body  . Dizziness and weakness   Immunizations Administered    Name Date Dose VIS Date Route   Pfizer COVID-19 Vaccine 11/01/2019  9:30 AM 0.3 mL 07/06/2019 Intramuscular   Manufacturer: [employer], [employer]   Lot: [id number]   NDC: [address]-2

## 2019-11-26 ENCOUNTER — Ambulatory Visit: Payer: Medicaid Other | Attending: Internal Medicine

## 2019-11-26 DIAGNOSIS — Z23 Encounter for immunization: Secondary | ICD-10-CM

## 2019-11-26 NOTE — Progress Notes (Signed)
   Covid-19 Vaccination Clinic  Name:  DIAR BERKEL    MRN: [id number] DOB: April 28, 1971  11/26/2019  Mr. Fecher was observed post Covid-19 immunization for 15 minutes without incident. He was provided with Vaccine Information Sheet and instruction to access the V-Safe system.   Mr. Busche was instructed to call 911 with any severe reactions post vaccine: Marland Kitchen Difficulty breathing  . Swelling of face and throat  . A fast heartbeat  . A bad rash all over body  . Dizziness and weakness   Immunizations Administered    Name Date Dose VIS Date Route   Pfizer COVID-19 Vaccine 11/26/2019 11:11 AM 0.3 mL 09/19/2018 Intramuscular   Manufacturer: [employer], [employer]   Lot: [id number]   NDC: [address]-6

## 2019-11-30 ENCOUNTER — Emergency Department (HOSPITAL_COMMUNITY)
Admission: EM | Admit: 2019-11-30 | Discharge: 2019-11-30 | Disposition: A | Discharge status: Home / Self Care | Payer: Medicaid Other | Attending: Emergency Medicine | Admitting: Emergency Medicine

## 2019-11-30 ENCOUNTER — Encounter (HOSPITAL_COMMUNITY): Payer: Self-pay | Admitting: Emergency Medicine

## 2019-11-30 ENCOUNTER — Other Ambulatory Visit: Payer: Self-pay

## 2019-11-30 DIAGNOSIS — Z7984 Long term (current) use of oral hypoglycemic drugs: Secondary | ICD-10-CM | POA: Diagnosis not present

## 2019-11-30 DIAGNOSIS — R110 Nausea: Secondary | ICD-10-CM | POA: Insufficient documentation

## 2019-11-30 DIAGNOSIS — E119 Type 2 diabetes mellitus without complications: Secondary | ICD-10-CM | POA: Insufficient documentation

## 2019-11-30 DIAGNOSIS — R1084 Generalized abdominal pain: Secondary | ICD-10-CM | POA: Diagnosis not present

## 2019-11-30 DIAGNOSIS — R197 Diarrhea, unspecified: Secondary | ICD-10-CM | POA: Insufficient documentation

## 2019-11-30 LAB — CBC
HCT: 43.8 % (ref 39.0–52.0)
Hemoglobin: 14.3 g/dL (ref 13.0–17.0)
MCH: 29.2 pg (ref 26.0–34.0)
MCHC: 32.6 g/dL (ref 30.0–36.0)
MCV: 89.6 fL (ref 80.0–100.0)
Platelets: 218 10*3/uL (ref 150–400)
RBC: 4.89 MIL/uL (ref 4.22–5.81)
RDW: 13.2 % (ref 11.5–15.5)
WBC: 6.1 10*3/uL (ref 4.0–10.5)
nRBC: 0 % (ref 0.0–0.2)

## 2019-11-30 LAB — COMPREHENSIVE METABOLIC PANEL
ALT: 25 U/L (ref 0–44)
AST: 26 U/L (ref 15–41)
Albumin: 4.1 g/dL (ref 3.5–5.0)
Alkaline Phosphatase: 65 U/L (ref 38–126)
Anion gap: 12 (ref 5–15)
BUN: 18 mg/dL (ref 6–20)
CO2: 23 mmol/L (ref 22–32)
Calcium: 8.8 mg/dL — ABNORMAL LOW (ref 8.9–10.3)
Chloride: 102 mmol/L (ref 98–111)
Creatinine, Ser: 0.78 mg/dL (ref 0.61–1.24)
GFR calc Af Amer: >60 mL/min (ref >60)
GFR calc non Af Amer: >60 mL/min (ref >60)
Glucose, Bld: 164 mg/dL — ABNORMAL HIGH (ref 70–99)
Potassium: 3.8 mmol/L (ref 3.5–5.1)
Sodium: 137 mmol/L (ref 135–145)
Total Bilirubin: 0.5 mg/dL (ref 0.3–1.2)
Total Protein: 7.5 g/dL (ref 6.5–8.1)

## 2019-11-30 LAB — POC OCCULT BLOOD, ED: Fecal Occult Bld: NEGATIVE

## 2019-11-30 LAB — LIPASE, BLOOD: Lipase: 26 U/L (ref 11–51)

## 2019-11-30 MED ORDER — SODIUM CHLORIDE 0.9% FLUSH: 3.0000 mL | Freq: Once | INTRAVENOUS | Status: DC

## 2019-11-30 MED ORDER — SODIUM CHLORIDE 0.9 % IV BOLUS
1000.0000 mL | Freq: Once | INTRAVENOUS | Status: AC
  Administered 2019-11-30: 19:00:00 1000 mL via INTRAVENOUS

## 2019-11-30 MED ORDER — ONDANSETRON 4 MG PO TBDP: 4.0000 mg | ORAL_TABLET | Freq: Three times a day (TID) | ORAL | 0 refills | Status: DC | PRN

## 2019-11-30 MED ORDER — DICYCLOMINE HCL 20 MG PO TABS: 20.0000 mg | ORAL_TABLET | Freq: Three times a day (TID) | ORAL | 0 refills | Status: DC | PRN

## 2019-11-30 NOTE — Discharge Instructions (Signed)
You were seen in the emergency department today for diarrhea and dark stool.  Your work-up was overall reassuring.  Your blood counts did not show anemia.  Your stool did not have blood in it.  Your electrolytes were reassuring.  You were given fluids in the ER.  We are sending you home with Zofran to take every 8 hours as needed for nausea and vomiting as well as Bentyl to take every 8 hours as needed for abdominal cramping/diarrhea.  Please follow attached diet guidelines.  We have prescribed you new medication(s) today. Discuss the medications prescribed today with your pharmacist as they can have adverse effects and interactions with your other medicines including over the counter and prescribed medications. Seek medical evaluation if you start to experience new or abnormal symptoms after taking one of these medicines, seek care immediately if you start to experience difficulty breathing, feeling of your throat closing, facial swelling, or rash as these could be indications of a more serious allergic reaction  Please follow-up with your primary care provider within 3 days for reevaluation.  Return to the ER for new or worsening symptoms including but not limited to increased pain, blood in your stool, fever, inability to keep fluids down, lightheadedness, dizziness, passing out, or any other concerns.

## 2019-11-30 NOTE — ED Triage Notes (Signed)
Patient arrives to ED with complaints of diarrhea for the last three days.Patient states the diarrhea has been black in color. Patient states that he is very weak and fatigued. No hx of coloscopy. Patient denies abdominal pain, nausea, and vomiting.Denies any pain at this time.

## 2019-11-30 NOTE — ED Notes (Signed)
Pt verbalized understanding of d/c instructions, follow up care and s/s requiring return to ed. Pt had no further questions at this time and refused wheel chair out. Pt ambulated unassisted to exit.

## 2019-11-30 NOTE — ED Provider Notes (Signed)
[facility] Provider Note   CSN: [id number] Arrival date & time: 11/30/19  1625     History Chief Complaint  Patient presents with  . Diarrhea  . Fatigue    Matthew Fowler is a 49 y.o. male  with a history of hypertension, hypercholesterolemia, migraines, and diabetes mellitus who presents to the emergency department with complaints of diarrhea x 3 days. Patient reports he is having 3-4 episodes of diarrhea per day, describes it as dark in color with associated nausea and abdominal cramping during bowel movements otherwise no pain. He has also felt somewhat fatigued. No other alleviating/aggravating factors. Denies fever, chills, emesis, bright red blood per rectum, dysuria, dizziness, lightheadedness, or syncope. Denies NSAID use, blood thinner use, or frequent use of alcohol (reports occasional).  He has never had a colonoscopy before, he is scheduled 1 for June 2021. Denies recent foreign travel or abx use.   HPI     Past Medical History:  Diagnosis Date  . Diabetes mellitus without complication (HCC)   . High cholesterol   . Hypertension   . Migraine     There are no problems to display for this patient.   History reviewed. No pertinent surgical history.     Family History  Problem Relation Age of Onset  . Cancer Mother     Social History   Tobacco Use  . Smoking status: Former Smoker    Types: Cigarettes  . Smokeless tobacco: Never Used  Substance Use Topics  . Alcohol use: Not Currently  . Drug use: Never    Home Medications Prior to Admission medications   Medication Sig Start Date End Date Taking? Authorizing Provider  cetirizine (ZYRTEC) 10 MG tablet Take 1 tablet (10 mg total) by mouth daily. 09/04/19   Bast, Gloris Manchester A, NP  fluticasone (FLONASE) 50 MCG/ACT nasal spray Place 1 spray into both nostrils daily. 09/04/19   Bast, Gloris Manchester A, NP  lisinopril (ZESTRIL) 10 MG tablet Take 10 mg by mouth daily. 11/20/18   [provider]  naproxen (NAPROSYN) 500 MG tablet Take 1 tablet (500 mg total) by mouth 2 (two) times daily. 04/18/19   Wieters, Hallie C, PA-C  rizatriptan (MAXALT) 10 MG tablet Take 1 tab at onset of headache. May repeat once in 24 hours if no relief after 2 hours. 11/08/18   [provider]  tiZANidine (ZANAFLEX) 4 MG tablet Take 1-2 tablets (4-8 mg total) by mouth every 6 (six) hours as needed for muscle spasms. 03/08/19   Eustace Moore, MD    Allergies    Patient has no known allergies.  Review of Systems   Review of Systems Constitutional: Positive for fatigue. Negative for chills and fever.  Respiratory: Negative for shortness of breath.   Cardiovascular: Negative for chest pain.  Gastrointestinal: Positive for abdominal pain (mild cramping during BM, otherwise none), diarrhea and nausea. Negative for anal bleeding, constipation and vomiting.  Genitourinary: Negative for dysuria.  Neurological: Negative for dizziness, syncope and light-headedness.  All other systems reviewed and are negative. Physical Exam Updated Vital Signs BP 131/88   Pulse 84   Temp 98.2 F (36.8 C) (Oral)   Resp 18   Ht 5\' 8"  (1.727 m)   Wt 93.9 kg   SpO2 97%   BMI 31.47 kg/m   Physical Exam Vitals and nursing note reviewed.  Constitutional:  General: He is not in acute distress. Appearance: He is well-developed. He is not toxic-appearing.  HENT:  Head: Normocephalic and atraumatic.  Eyes:  General:  Right eye: No discharge.  Left eye: No discharge.  Conjunctiva/sclera: Conjunctivae normal.  Cardiovascular:  Rate and Rhythm: Normal rate and regular rhythm.  Pulmonary:  Effort: Pulmonary effort is normal. No respiratory distress.  Breath sounds: Normal breath sounds. No wheezing, rhonchi or rales.  Abdominal:  General: There is no distension.  Palpations: Abdomen is soft.  Tenderness: There is no abdominal tenderness. There is no guarding or rebound.  Genitourinary:    Comments: DRE performed with chaperone present, minimal stool present- dark brown, no melena/hematochezia/bright red blood.  Musculoskeletal:  Cervical back: Neck supple.  Skin:  General: Skin is warm and dry.  Findings: No rash.  Neurological:  Mental Status: He is alert.  Comments: Clear speech.  Psychiatric:  Behavior: Behavior normal.   ED Results / Procedures / Treatments   Labs (all labs ordered are listed, but only abnormal results are displayed) Labs Reviewed  COMPREHENSIVE METABOLIC PANEL - Abnormal; Notable for the following components:      Result Value   Glucose, Bld 164 (*)    Calcium 8.8 (*)    All other components within normal limits  LIPASE, BLOOD  CBC  URINALYSIS, ROUTINE W REFLEX MICROSCOPIC  POC OCCULT BLOOD, ED    EKG None  Radiology No results found.  Procedures Procedures (including critical care time)  Medications Ordered in ED Medications  sodium chloride flush (NS) 0.9 % injection 3 mL (3 mLs Intravenous Not Given 11/30/19 1822)  sodium chloride 0.9 % bolus 1,000 mL (1,000 mLs Intravenous New Bag/Given 11/30/19 1838)    ED Course  I have reviewed the triage vital signs and the nursing notes.  Pertinent labs & imaging results that were available during my care of the patient were reviewed by me and considered in my medical decision making (see chart for details).  [name] was evaluated in Emergency Department on 11/30/2019 for the symptoms described in the history of present illness. He/she was evaluated in the context of the global COVID-19 pandemic, which necessitated consideration that the patient might be at risk for infection with the SARS-CoV-2 virus that causes COVID-19. Institutional protocols and algorithms that pertain to the evaluation of patients at risk for COVID-19 are in a state of rapid change based on information released by regulatory bodies including the CDC and federal and state organizations. These policies and algorithms  were followed during the patient's care in the ED.    MDM Rules/Calculators/A&P                     Patient presents to the ED with complaints of dark colored diarrhea for the past 3 days. Nontoxic, vitals without significant abnormality. Abdomen nontender without peritoneal signs. DRE with minimal stool but no melena, hematochezia, or bright red blood.   DDx: Viral GI illness, diverticulitis, GI bleed, symptomatic anemia, electrolyte derangement, renal or liver failure.   Additional history obtained:  Additional history obtained from review of nursing notes and prior records.   Lab Tests:  I Ordered, reviewed, and interpreted labs, which included:  CBC: No anemia or leukocytosis. CMP: Hyperglycemia at 164 without anion gap elevation or acidosis to raise concern for DKA.  Renal function preserved.  LFTs WNL.  No significant electrolyte derangement. Lipase: WNL. Fecal occult testing: Negative  ED Course:  Work-up in the emergency department is overall reassuring.  His fecal occult test is negative, he has no anemia, no significant BUN elevation, do not  suspect significant GI bleed.  His abdomen is nontender without peritoneal signs, therefore do not suspect acute surgical abdomen, additionally do not suspect diverticulitis.  Labs overall without significant abnormality.  Suspect viral.  No recent travel or antibiotics.  Will treat supportively with Bentyl and Zofran with PCP follow-up. I discussed results, treatment plan, need for follow-up, and return precautions with the patient. Provided opportunity for questions, patient confirmed understanding and is in agreement with plan.   Portions of this note were generated with Scientist, clinical (histocompatibility and immunogenetics). Dictation errors may occur despite best attempts at proofreading.  Final Clinical Impression(s) / ED Diagnoses Final diagnoses:  Diarrhea, unspecified type    Rx / DC Orders ED Discharge Orders         Ordered    ondansetron (ZOFRAN  ODT) 4 MG disintegrating tablet  Every 8 hours PRN     11/30/19 1951    dicyclomine (BENTYL) 20 MG tablet  Every 8 hours PRN     11/30/19 [address] PA-C 11/30/19 1952    Sabas Sous, MD 12/06/19 Rich Fuchs

## 2020-01-23 ENCOUNTER — Other Ambulatory Visit: Payer: Self-pay

## 2020-01-23 ENCOUNTER — Encounter (HOSPITAL_COMMUNITY): Payer: Self-pay | Admitting: Emergency Medicine

## 2020-01-23 ENCOUNTER — Emergency Department (HOSPITAL_COMMUNITY): Payer: Medicaid Other

## 2020-01-23 ENCOUNTER — Emergency Department (HOSPITAL_COMMUNITY)
Admission: EM | Admit: 2020-01-23 | Discharge: 2020-01-24 | Disposition: A | Discharge status: Home / Self Care | Payer: Medicaid Other | Attending: Emergency Medicine | Admitting: Emergency Medicine

## 2020-01-23 DIAGNOSIS — M79671 Pain in right foot: Secondary | ICD-10-CM | POA: Insufficient documentation

## 2020-01-23 DIAGNOSIS — Z5321 Procedure and treatment not carried out due to patient leaving prior to being seen by health care provider: Secondary | ICD-10-CM | POA: Diagnosis not present

## 2020-01-23 NOTE — ED Triage Notes (Signed)
Pt c/o right foot pain, and bruising after part of his lawnmower fell on his foot today.

## 2020-01-23 NOTE — ED Notes (Signed)
Pt leaving AMA. Advised to return if symptoms worsen. 

## 2020-03-21 ENCOUNTER — Other Ambulatory Visit: Payer: Self-pay

## 2020-03-21 ENCOUNTER — Ambulatory Visit (HOSPITAL_COMMUNITY)
Admission: EM | Admit: 2020-03-21 | Discharge: 2020-03-21 | Disposition: A | Discharge status: Home / Self Care | Payer: Medicaid Other | Attending: Physician Assistant | Admitting: Physician Assistant

## 2020-03-21 DIAGNOSIS — J069 Acute upper respiratory infection, unspecified: Secondary | ICD-10-CM | POA: Insufficient documentation

## 2020-03-21 DIAGNOSIS — R05 Cough: Secondary | ICD-10-CM

## 2020-03-21 DIAGNOSIS — Z87891 Personal history of nicotine dependence: Secondary | ICD-10-CM | POA: Insufficient documentation

## 2020-03-21 DIAGNOSIS — Z79899 Other long term (current) drug therapy: Secondary | ICD-10-CM | POA: Insufficient documentation

## 2020-03-21 DIAGNOSIS — G43909 Migraine, unspecified, not intractable, without status migrainosus: Secondary | ICD-10-CM | POA: Diagnosis not present

## 2020-03-21 DIAGNOSIS — J029 Acute pharyngitis, unspecified: Secondary | ICD-10-CM

## 2020-03-21 DIAGNOSIS — E7800 Pure hypercholesterolemia, unspecified: Secondary | ICD-10-CM | POA: Insufficient documentation

## 2020-03-21 DIAGNOSIS — Z20822 Contact with and (suspected) exposure to covid-19: Secondary | ICD-10-CM | POA: Diagnosis not present

## 2020-03-21 DIAGNOSIS — I10 Essential (primary) hypertension: Secondary | ICD-10-CM | POA: Insufficient documentation

## 2020-03-21 DIAGNOSIS — Z791 Long term (current) use of non-steroidal anti-inflammatories (NSAID): Secondary | ICD-10-CM | POA: Insufficient documentation

## 2020-03-21 DIAGNOSIS — R519 Headache, unspecified: Secondary | ICD-10-CM

## 2020-03-21 DIAGNOSIS — E119 Type 2 diabetes mellitus without complications: Secondary | ICD-10-CM | POA: Insufficient documentation

## 2020-03-21 LAB — POCT RAPID STREP A, ED / UC: Streptococcus, Group A Screen (Direct): NEGATIVE

## 2020-03-21 MED ORDER — FLUTICASONE PROPIONATE 50 MCG/ACT NA SUSP
1.0000 | Freq: Every day | NASAL | 0 refills | Status: DC
Start: 2020-03-21 — End: 2020-07-04

## 2020-03-21 MED ORDER — BENZONATATE 100 MG PO CAPS: 100.0000 mg | ORAL_CAPSULE | Freq: Three times a day (TID) | ORAL | 0 refills | Status: AC

## 2020-03-21 MED ORDER — CEPACOL SORE THROAT 5.4 MG MT LOZG
1.0000 | LOZENGE | OROMUCOSAL | 0 refills | Status: DC | PRN
Start: 2020-03-21 — End: 2020-07-04

## 2020-03-21 NOTE — ED Provider Notes (Signed)
[facility]    CSN: [id number] Arrival date & time: 03/21/20  0815      History   Chief Complaint Chief Complaint  Patient presents with  . Cough  . Sore Throat  . Headache    HPI Matthew Fowler is Fowler 49 y.o. male.   Patient presents for cough, sore throat and headache.  Symptoms started 2 days ago.  Cough is been nonproductive.  Denies shortness of breath.  Denies chest pain.  Reports headache is frontal, different than his baseline migraines.  Denies blurry vision, photophobia.  Describes it as Fowler pressure.  He has been taking NyQuil and DayQuil with some relief of his symptoms.  Has been using Hall's cough drops.  Denies fever.  Endorses some body ache.  Denies nausea, vomiting, diarrhea.  Moving his bowels once Fowler day.  Denies abdominal pain.  No change in his urine patterns.  No change in taste or smell.  Reports he is required to have Covid testing as he did not go to work today.  No known sick contacts.     Past Medical History:  Diagnosis Date  . Diabetes mellitus without complication (HCC)   . High cholesterol   . Hypertension   . Migraine     There are no problems to display for this patient.   No past surgical history on file.     Home Medications    Prior to Admission medications   Medication Sig Start Date End Date Taking? Authorizing Provider  benzonatate (TESSALON) 100 MG capsule Take 1-2 capsules (100-200 mg total) by mouth 3 (three) times daily for 10 days. 03/21/20 03/31/20  Shantea Poulton, Veryl Speak, PA-C  cetirizine (ZYRTEC) 10 MG tablet Take 1 tablet (10 mg total) by mouth daily. 09/04/19   Dahlia Byes A, NP  dicyclomine (BENTYL) 20 MG tablet Take 1 tablet (20 mg total) by mouth every 8 (eight) hours as needed for spasms. 11/30/19   Petrucelli, Samantha R, PA-C  fluticasone (FLONASE) 50 MCG/ACT nasal spray Place 1 spray into both nostrils daily. 03/21/20   Keniyah Gelinas, Veryl Speak, PA-C  lisinopril (ZESTRIL) 10 MG tablet Take 10 mg by mouth daily. 11/20/18   [provider]  Menthol (CEPACOL SORE THROAT) 5.4 MG LOZG Use as directed 1 lozenge (5.4 mg total) in the mouth or throat every 2 (two) hours as needed. 03/21/20   Sahian Kerney, Veryl Speak, PA-C  naproxen (NAPROSYN) 500 MG tablet Take 1 tablet (500 mg total) by mouth 2 (two) times daily. 04/18/19   Wieters, Hallie C, PA-C  ondansetron (ZOFRAN ODT) 4 MG disintegrating tablet Take 1 tablet (4 mg total) by mouth every 8 (eight) hours as needed for nausea or vomiting. 11/30/19   Petrucelli, Samantha R, PA-C  rizatriptan (MAXALT) 10 MG tablet Take 1 tab at onset of headache. May repeat once in 24 hours if no relief after 2 hours. 11/08/18   [provider]  tiZANidine (ZANAFLEX) 4 MG tablet Take 1-2 tablets (4-8 mg total) by mouth every 6 (six) hours as needed for muscle spasms. 03/08/19   Eustace Moore, MD    Family History Family History  Problem Relation Age of Onset  . Cancer Mother     Social History Social History   Tobacco Use  . Smoking status: Former Smoker    Types: Cigarettes  . Smokeless tobacco: Never Used  Vaping Use  . Vaping Use: Never used  Substance Use Topics  . Alcohol use: Not Currently  . Drug  use: Never     Allergies   Patient has no known allergies.   Review of Systems Review of Systems   Physical Exam Triage Vital Signs ED Triage Vitals  Enc Vitals Group     BP      Pulse      Resp      Temp      Temp src      SpO2      Weight      Height      Head Circumference      Peak Flow      Pain Score      Pain Loc      Pain Edu?      Excl. in GC?    No data found.  Updated Vital Signs BP (S) (!) 152/103 (BP Location: Right Arm) Comment: pt did NOT take BP meds today  Pulse 86   Temp 98.2 F (36.8 C) (Oral)   Resp 17   Wt 208 lb 6.4 oz (94.5 kg)   BMI 31.69 kg/m   Visual Acuity Right Eye Distance:   Left Eye Distance:   Bilateral Distance:    Right Eye Near:   Left Eye Near:    Bilateral Near:     Physical Exam Vitals and nursing  note reviewed.  Constitutional:      General: He is not in acute distress.    Appearance: He is well-developed. He is not ill-appearing.  HENT:     Head: Normocephalic and atraumatic.     Nose: Congestion and rhinorrhea present.     Mouth/Throat:     Mouth: Mucous membranes are moist.     Pharynx: Oropharynx is clear. Uvula midline. No posterior oropharyngeal erythema or uvula swelling.     Tonsils: No tonsillar exudate or tonsillar abscesses. 1+ on the right. 1+ on the left.  Eyes:     Conjunctiva/sclera: Conjunctivae normal.  Cardiovascular:     Rate and Rhythm: Normal rate and regular rhythm.     Heart sounds: No murmur heard.   Pulmonary:     Effort: Pulmonary effort is normal. No respiratory distress.     Breath sounds: Normal breath sounds. No stridor. No wheezing, rhonchi or rales.  Abdominal:     Palpations: Abdomen is soft.     Tenderness: There is no abdominal tenderness.  Musculoskeletal:     Cervical back: Neck supple.  Lymphadenopathy:     Cervical: No cervical adenopathy.  Skin:    General: Skin is warm and dry.  Neurological:     Mental Status: He is alert.      UC Treatments / Results  Labs (all labs ordered are listed, but only abnormal results are displayed) Labs Reviewed  CULTURE, GROUP Fowler STREP ([facility])  SARS CORONAVIRUS 2 (TAT 6-24 HRS)  POCT RAPID STREP Fowler, ED / UC    EKG   Radiology No results found.  Procedures Procedures (including critical care time)  Medications Ordered in UC Medications - No data to display  Initial Impression / Assessment and Plan / UC Course  I have reviewed the triage vital signs and the nursing notes.  Pertinent labs & imaging results that were available during my care of the patient were reviewed by me and considered in my medical decision making (see chart for details).    #Viral URI Patient is Fowler 49 year old presenting with viral upper respiratory symptoms.  Reassuring vitals and exam today.  We will treat  symptomatically.  Covid sent.  Return, follow-up and emergency department precautions discussed.  Patient verbalized understanding plan of care. Final Clinical Impressions(s) / UC Diagnoses   Final diagnoses:  Viral upper respiratory tract infection     Discharge Instructions     Take medications as prescribed  If severe symptoms of shortness of breath, severe vomiting, worst headache of your life, go to the emergency department  Follow-up with your primary care in about 1 week.  If your Covid-19 test is positive, you will receive Fowler phone call from [facility] regarding your results. Negative test results are not called. Both positive and negative results area always visible on MyChart. If you do not have Fowler MyChart account, sign up instructions are in your discharge papers.   Persons who are directed to care for themselves at home may discontinue isolation under the following conditions:  . At least 10 days have passed since symptom onset and . At least 24 hours have passed without running Fowler fever (this means without the use of fever-reducing medications) and . Other symptoms have improved.  Persons infected with COVID-19 who never develop symptoms may discontinue isolation and other precautions 10 days after the date of their first positive COVID-19 test.       ED Prescriptions    Medication Sig Dispense Auth. Provider   fluticasone (FLONASE) 50 MCG/ACT nasal spray Place 1 spray into both nostrils daily. 15.8 mL Elen Acero, Veryl Speak, PA-C   benzonatate (TESSALON) 100 MG capsule Take 1-2 capsules (100-200 mg total) by mouth 3 (three) times daily for 10 days. 60 capsule Mads Borgmeyer, Veryl Speak, PA-C   Menthol (CEPACOL SORE THROAT) 5.4 MG LOZG Use as directed 1 lozenge (5.4 mg total) in the mouth or throat every 2 (two) hours as needed. 30 lozenge Theodis Kinsel, Veryl Speak, PA-C     PDMP not reviewed this encounter.   Hermelinda Medicus, PA-C 03/21/20 [phone]

## 2020-03-21 NOTE — ED Triage Notes (Signed)
Pt is here with a cough, sore throat with headache that started 2 days ago, pt has taken DayQuil/NyQuil to relieve discomfort. Pt wants COVID testing today with a work note.

## 2020-03-21 NOTE — Discharge Instructions (Signed)
Take medications as prescribed  If severe symptoms of shortness of breath, severe vomiting, worst headache of your life, go to the emergency department  Follow-up with your primary care in about 1 week.  If your Covid-19 test is positive, you will receive a phone call from [facility] regarding your results. Negative test results are not called. Both positive and negative results area always visible on MyChart. If you do not have a MyChart account, sign up instructions are in your discharge papers.   Persons who are directed to care for themselves at home may discontinue isolation under the following conditions:   At least 10 days have passed since symptom onset and  At least 24 hours have passed without running a fever (this means without the use of fever-reducing medications) and  Other symptoms have improved.  Persons infected with COVID-19 who never develop symptoms may discontinue isolation and other precautions 10 days after the date of their first positive COVID-19 test.

## 2020-03-22 LAB — SARS CORONAVIRUS 2 (TAT 6-24 HRS): SARS Coronavirus 2: NEGATIVE

## 2020-03-23 LAB — CULTURE, GROUP A STREP (THRC)

## 2020-07-04 ENCOUNTER — Encounter (HOSPITAL_COMMUNITY): Payer: Self-pay | Admitting: Emergency Medicine

## 2020-07-04 ENCOUNTER — Other Ambulatory Visit: Payer: Self-pay

## 2020-07-04 ENCOUNTER — Ambulatory Visit (HOSPITAL_COMMUNITY)
Admission: EM | Admit: 2020-07-04 | Discharge: 2020-07-04 | Disposition: A | Discharge status: Other Acute Inpatient Hospital | Payer: Medicaid Other

## 2020-07-04 ENCOUNTER — Emergency Department (HOSPITAL_COMMUNITY)
Admission: EM | Admit: 2020-07-04 | Discharge: 2020-07-04 | Disposition: A | Discharge status: Home / Self Care | Payer: Medicaid Other | Attending: Emergency Medicine | Admitting: Emergency Medicine

## 2020-07-04 ENCOUNTER — Emergency Department (HOSPITAL_COMMUNITY): Payer: Medicaid Other

## 2020-07-04 ENCOUNTER — Encounter (HOSPITAL_COMMUNITY): Payer: Self-pay

## 2020-07-04 DIAGNOSIS — R519 Headache, unspecified: Secondary | ICD-10-CM

## 2020-07-04 DIAGNOSIS — Z79899 Other long term (current) drug therapy: Secondary | ICD-10-CM | POA: Insufficient documentation

## 2020-07-04 DIAGNOSIS — Z8669 Personal history of other diseases of the nervous system and sense organs: Secondary | ICD-10-CM | POA: Diagnosis not present

## 2020-07-04 DIAGNOSIS — Z87891 Personal history of nicotine dependence: Secondary | ICD-10-CM | POA: Insufficient documentation

## 2020-07-04 DIAGNOSIS — I10 Essential (primary) hypertension: Secondary | ICD-10-CM | POA: Diagnosis not present

## 2020-07-04 DIAGNOSIS — H532 Diplopia: Secondary | ICD-10-CM | POA: Diagnosis not present

## 2020-07-04 DIAGNOSIS — R42 Dizziness and giddiness: Secondary | ICD-10-CM | POA: Diagnosis not present

## 2020-07-04 DIAGNOSIS — E119 Type 2 diabetes mellitus without complications: Secondary | ICD-10-CM | POA: Diagnosis not present

## 2020-07-04 LAB — BASIC METABOLIC PANEL
Anion gap: 10 (ref 5–15)
BUN: 16 mg/dL (ref 6–20)
CO2: 27 mmol/L (ref 22–32)
Calcium: 9.4 mg/dL (ref 8.9–10.3)
Chloride: 99 mmol/L (ref 98–111)
Creatinine, Ser: 0.73 mg/dL (ref 0.61–1.24)
GFR, Estimated: >60 mL/min (ref >60)
Glucose, Bld: 169 mg/dL — ABNORMAL HIGH (ref 70–99)
Potassium: 4 mmol/L (ref 3.5–5.1)
Sodium: 136 mmol/L (ref 135–145)

## 2020-07-04 LAB — CBC
HCT: 41.2 % (ref 39.0–52.0)
Hemoglobin: 13.9 g/dL (ref 13.0–17.0)
MCH: 29.8 pg (ref 26.0–34.0)
MCHC: 33.7 g/dL (ref 30.0–36.0)
MCV: 88.2 fL (ref 80.0–100.0)
Platelets: 250 10*3/uL (ref 150–400)
RBC: 4.67 MIL/uL (ref 4.22–5.81)
RDW: 13.6 % (ref 11.5–15.5)
WBC: 5.6 10*3/uL (ref 4.0–10.5)
nRBC: 0 % (ref 0.0–0.2)

## 2020-07-04 LAB — URINALYSIS, ROUTINE W REFLEX MICROSCOPIC
Bilirubin Urine: NEGATIVE
Glucose, UA: >=500 mg/dL — AB
Hgb urine dipstick: NEGATIVE
Ketones, ur: NEGATIVE mg/dL
Leukocytes,Ua: NEGATIVE
Nitrite: NEGATIVE
Protein, ur: NEGATIVE mg/dL
Specific Gravity, Urine: 1.026 (ref 1.005–1.030)
pH: 5 (ref 5.0–8.0)

## 2020-07-04 MED ORDER — IOHEXOL 350 MG/ML SOLN
80.0000 mL | Freq: Once | INTRAVENOUS | Status: AC | PRN
  Administered 2020-07-04: 80 mL via INTRAVENOUS

## 2020-07-04 NOTE — ED Triage Notes (Signed)
Pt presents with dizziness and double vision X 2 days.

## 2020-07-04 NOTE — ED Provider Notes (Signed)
[facility] EMERGENCY DEPARTMENT Provider Note   CSN: [id number] Arrival date & time: 07/04/20  1326     History Chief Complaint  Patient presents with  . Dizziness    Matthew Fowler is a 49 y.o. male.  HPI 49 year old male history of type 2 diabetes, hyper tension, hypercholesterolemia, migraines presents from urgent care with reports of headache, double vision, and feeling lightheaded.  Symptoms were described as dizziness both in urgent care note and in triage nurse note.  Patient denies any vertigo or movement of extremities.  He describes feeling lightheaded when he brings his head up.  Symptoms occur more often when he goes from sitting to standing.  He has had the symptoms occur when he was sitting, however.  He describes as feeling he is going to pass out.  He states that he has been having headaches regularly over the past week and a half.  Describes it is diffusely across his head.  This occurs approximately once a day and he also feels lightheaded at that time.  He states he had one episode of double vision with this.  Otherwise, the double vision and blurriness have resolved.  He denies any nausea, vomiting, fever, neck stiffness, chest pain, dyspnea, or change in medications.  He drinks alcohol 1-2 times a week and occasionally smokes with that.  He does not drink and smoke every day. He endorses one episode of double vision that resolved spontaneously.     Past Medical History:  Diagnosis Date  . Diabetes mellitus without complication (HCC)   . High cholesterol   . Hypertension   . Migraine     There are no problems to display for this patient.   History reviewed. No pertinent surgical history.     Family History  Problem Relation Age of Onset  . Cancer Mother     Social History   Tobacco Use  . Smoking status: Former Smoker    Types: Cigarettes  . Smokeless tobacco: Never Used  Vaping Use  . Vaping Use: Never used  Substance Use Topics   . Alcohol use: Not Currently  . Drug use: Never    Home Medications Prior to Admission medications   Medication Sig Start Date End Date Taking? Authorizing Provider  cetirizine (ZYRTEC) 10 MG tablet Take 1 tablet (10 mg total) by mouth daily. 09/04/19   Dahlia Byes A, NP  dicyclomine (BENTYL) 20 MG tablet Take 1 tablet (20 mg total) by mouth every 8 (eight) hours as needed for spasms. 11/30/19   Petrucelli, Samantha R, PA-C  fluticasone (FLONASE) 50 MCG/ACT nasal spray Place 1 spray into both nostrils daily. 03/21/20   Darr, Gerilyn Pilgrim, PA-C  lisinopril (ZESTRIL) 10 MG tablet Take 10 mg by mouth daily. 11/20/18   [provider]  Menthol (CEPACOL SORE THROAT) 5.4 MG LOZG Use as directed 1 lozenge (5.4 mg total) in the mouth or throat every 2 (two) hours as needed. 03/21/20   Darr, Gerilyn Pilgrim, PA-C  naproxen (NAPROSYN) 500 MG tablet Take 1 tablet (500 mg total) by mouth 2 (two) times daily. 04/18/19   Wieters, Hallie C, PA-C  ondansetron (ZOFRAN ODT) 4 MG disintegrating tablet Take 1 tablet (4 mg total) by mouth every 8 (eight) hours as needed for nausea or vomiting. 11/30/19   Petrucelli, Samantha R, PA-C  rizatriptan (MAXALT) 10 MG tablet Take 1 tab at onset of headache. May repeat once in 24 hours if no relief after 2 hours. 11/08/18   [provider]  tiZANidine (ZANAFLEX) 4 MG tablet Take 1-2 tablets (4-8 mg total) by mouth every 6 (six) hours as needed for muscle spasms. 03/08/19   Eustace Moore, MD    Allergies    Patient has no known allergies.  Review of Systems   Review of Systems  All other systems reviewed and are negative.   Physical Exam Updated Vital Signs BP (!) 141/93   Pulse 68   Temp 98.2 F (36.8 C) (Oral)   Resp 18   Ht 1.727 m (5\' 8" )   Wt 95.3 kg   SpO2 100%   BMI 31.93 kg/m   Physical Exam Vitals and nursing note reviewed.  Constitutional:      Appearance: Normal appearance.  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     Left Ear: External ear normal.     Nose: Nose normal.     Mouth/Throat:     Mouth: Mucous membranes are moist.  Eyes:     Extraocular Movements: Extraocular movements intact.     Pupils: Pupils are equal, round, and reactive to light.  Cardiovascular:     Rate and Rhythm: Normal rate and regular rhythm.     Pulses: Normal pulses.     Heart sounds: Normal heart sounds.  Pulmonary:     Effort: Pulmonary effort is normal.     Breath sounds: Normal breath sounds.  Abdominal:     General: Abdomen is flat.     Palpations: Abdomen is soft.  Musculoskeletal:        General: Normal range of motion.     Cervical back: Normal range of motion.  Skin:    General: Skin is warm and dry.     Capillary Refill: Capillary refill takes less than 2 seconds.  Neurological:     General: No focal deficit present.     Mental Status: He is alert and oriented to person, place, and time.     Cranial Nerves: No cranial nerve deficit.     Motor: No weakness.  Psychiatric:        Mood and Affect: Mood normal.     ED Results / Procedures / Treatments   Labs (all labs ordered are listed, but only abnormal results are displayed) Labs Reviewed  BASIC METABOLIC PANEL  CBC  URINALYSIS, ROUTINE W REFLEX MICROSCOPIC  CBG MONITORING, ED    EKG EKG Interpretation  Date/Time:  Friday July 04 2020 13:32:39 EST Ventricular Rate:  65 PR Interval:  160 QRS Duration: 140 QT Interval:  394 QTC Calculation: 409 R Axis:   -81 Text Interpretation: Normal sinus rhythm Left axis deviation Right bundle branch block Abnormal ECG Confirmed by 12-01-1978 [id number]) on 07/04/2020 2:02:22 PM   Radiology No results found.  Procedures .Critical Care Performed by: 14/04/2020, MD Authorized by: Margarita Grizzle, MD   Critical care provider statement:    Critical care time (minutes):  45   Critical care end time:  07/04/2020 3:30 PM   Critical care  was time spent personally by me on the following activities:  Discussions with consultants, evaluation of patient's response to treatment, examination of patient, ordering and performing treatments and interventions, ordering and review of laboratory studies, ordering and review of radiographic studies, pulse oximetry, re-evaluation of patient's condition, obtaining history from patient or surrogate and review of old charts   (including critical care time)  Medications Ordered in ED Medications - No data to display  ED Course  I have reviewed the triage vital signs and the nursing notes.  Pertinent labs & imaging results that were available during my care of the patient were reviewed by me and considered in my medical decision making (see chart for details).    MDM Rules/Calculators/A&P                         49 year old male history of hypertension, diabetes, high cholesterol, family history of brain cancer presents today complaining of episodes of lightheadedness.  He also has had one episode of diplopia.  Patient has had labs ordered, CT head, CT angio head and neck ordered.  These are pending.  I have discussed the patient's care with Dr. Rodena Medin and he is excepted the patient in signout will disposition after return of labs and radiology studies. Final Clinical Impression(s) / ED Diagnoses Final diagnoses:  Lightheadedness    Rx / DC Orders ED Discharge Orders    None       Margarita Grizzle, MD 07/04/20 1530

## 2020-07-04 NOTE — ED Provider Notes (Addendum)
[facility]    CSN: [id number] Arrival date & time: 07/04/20  1057      History   Chief Complaint Chief Complaint  Patient presents with  . Dizziness       . Double Vision    HPI Matthew Fowler is a 49 y.o. male.   Pt states for a few days now he has noticed headache getting worse not relieved by pain meds, blurred vision, confusion at times. 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 He did not even notice till in room that he put on 2 different types of shoes this morning. His mother died a few years ago from a brain tumor, he is scared this is the cause. Does have a hx of HP and DM but he felt these was under control. Pt did not want ems he would drive to er. Alert x4 while in room. Denies any current chest pain.      Past Medical History:  Diagnosis Date  . Diabetes mellitus without complication (HCC)   . High cholesterol   . Hypertension   . Migraine     There are no problems to display for this patient.   History reviewed. No pertinent surgical history.     Home Medications    Prior to Admission medications   Medication Sig Start Date End Date Taking? Authorizing Provider  cetirizine (ZYRTEC) 10 MG tablet Take 1 tablet (10 mg total) by mouth daily. 09/04/19   Dahlia Byes A, NP  dicyclomine (BENTYL) 20 MG tablet Take 1 tablet (20 mg total) by mouth every 8 (eight) hours as needed for spasms. 11/30/19   Petrucelli, Samantha R, PA-C  fluticasone (FLONASE) 50 MCG/ACT nasal spray Place 1 spray into both nostrils daily. 03/21/20   Darr, Gerilyn Pilgrim, PA-C  lisinopril (ZESTRIL) 10 MG tablet Take 10 mg by mouth daily. 11/20/18   [provider]  Menthol (CEPACOL SORE THROAT) 5.4 MG LOZG Use as directed 1 lozenge (5.4 mg total) in the mouth or throat every 2 (two) hours as needed. 03/21/20   Darr, Gerilyn Pilgrim, PA-C  naproxen (NAPROSYN) 500 MG tablet Take 1 tablet (500 mg total) by mouth 2 (two) times daily. 04/18/19    Wieters, Hallie C, PA-C  ondansetron (ZOFRAN ODT) 4 MG disintegrating tablet Take 1 tablet (4 mg total) by mouth every 8 (eight) hours as needed for nausea or vomiting. 11/30/19   Petrucelli, Samantha R, PA-C  rizatriptan (MAXALT) 10 MG tablet Take 1 tab at onset of headache. May repeat once in 24 hours if no relief after 2 hours. 11/08/18   [provider]  tiZANidine (ZANAFLEX) 4 MG tablet Take 1-2 tablets (4-8 mg total) by mouth every 6 (six) hours as needed for muscle spasms. 03/08/19   Eustace Moore, MD    Family History Family History  Problem Relation Age of Onset  . Cancer Mother     Social History Social History   Tobacco Use  . Smoking status: Former Smoker    Types: Cigarettes  . Smokeless tobacco: Never Used  Vaping Use  . Vaping Use: Never used  Substance Use Topics  . Alcohol use: Not Currently  . Drug use: Never     Allergies   Patient has no known allergies.   Review of Systems Review of Systems  HENT: Negative.   Eyes: Positive for photophobia and visual disturbance. Negative for pain.  Respiratory: Negative.   Cardiovascular: Negative.  Gastrointestinal: Negative.   Genitourinary: Negative.   Skin: Negative.   Neurological: Positive for dizziness, syncope and headaches. Negative for tremors, seizures, facial asymmetry, speech difficulty, weakness, light-headedness and numbness.     Physical Exam Triage Vital Signs ED Triage Vitals  Enc Vitals Group     BP 07/04/20 1119 133/86     Pulse Rate 07/04/20 1119 78     Resp 07/04/20 1119 16     Temp 07/04/20 1119 98.6 F (37 C)     Temp Source 07/04/20 1119 Oral     SpO2 07/04/20 1119 99 %     Weight --      Height --      Head Circumference --      Peak Flow --      Pain Score 07/04/20 1118 0     Pain Loc --      Pain Edu? --      Excl. in GC? --    No data found.  Updated Vital Signs BP 133/86 (BP Location: Right Arm)   Pulse 78   Temp 98.6 F (37 C) (Oral)   Resp 16    SpO2 99%   Visual Acuity Right Eye Distance: 20/40 Left Eye Distance: 20/70 Bilateral Distance: 20/50      Physical Exam Constitutional:      Appearance: Normal appearance.  HENT:     Head: Normocephalic.     Nose: Nose normal.  Eyes:     Pupils: Pupils are equal, round, and reactive to light.  Cardiovascular:     Rate and Rhythm: Normal rate.     Pulses: Normal pulses.  Pulmonary:     Effort: Pulmonary effort is normal.  Abdominal:     General: Abdomen is flat.  Musculoskeletal:        General: Normal range of motion.  Skin:    General: Skin is warm.     Capillary Refill: Capillary refill takes less than 2 seconds.  Neurological:     General: No focal deficit present.     Mental Status: He is alert and oriented to person, place, and time.     Comments: Alert x4, c/o headache generalized, walked to room , equal grip       UC Treatments / Results  Labs (all labs ordered are listed, but only abnormal results are displayed) Labs Reviewed - No data to display  EKG   Radiology No results found.  Procedures Procedures (including critical care time)  Medications Ordered in UC Medications - No data to display  Initial Impression / Assessment and Plan / UC Course  I have reviewed the triage vital signs and the nursing notes.  Pertinent labs & imaging results that were available during my care of the patient were reviewed by me and considered in my medical decision making (see chart for details).     Spoke to MD lampty about pt he instructed to go ER  Pt will need a head ct and further test  Pt agrees with this treatment plan and will have someone to drive to the er. Reviewed old record  Final Clinical Impressions(s) / UC Diagnoses   Final diagnoses:  Dizziness  Vertigo   Discharge Instructions   None    ED Prescriptions    None     PDMP not reviewed this encounter.   Coralyn Mark, NP 07/04/20 1329    Coralyn Mark, NP 07/04/20  1331

## 2020-07-04 NOTE — ED Notes (Signed)
Patient returned from CT

## 2020-07-04 NOTE — Discharge Instructions (Addendum)
Please return for any problem.  Take a baby aspirin every day as instructed.  Follow-up closely with your regular care providers as instructed.

## 2020-07-04 NOTE — Discharge Instructions (Addendum)
Go to the ER r/o stroke, tumor or bleed

## 2020-07-04 NOTE — ED Notes (Signed)
Patient is being discharged from the Urgent Care and sent to the Emergency Department via Personal Vehicle . Per Clovis Riley NP, patient is in need of higher level of care due to Dizziness and Double Vision. Patient is aware and verbalizes understanding of plan of care.  Vitals:   07/04/20 1119  BP: 133/86  Pulse: 78  Resp: 16  Temp: 98.6 F (37 C)  SpO2: 99%

## 2020-07-04 NOTE — ED Triage Notes (Signed)
Pt co HA, Dizziness and blurred vision for 2 weeks, sent here by UC for further evaluation. Pt is AO x 4 no neuro deficit noticed on triage.

## 2020-07-04 NOTE — ED Provider Notes (Signed)
Patient seen after prior ED provider.  Patient is asymptomatic.  He desires discharge.  Patient's case and CTA findings discussed with neuro team.  Dr. Amada Jupiter does not feel that the patient requires further ED or inpatient work-up at this time.  Patient is advised that a baby aspirin taken daily would be possibly preventative of future significant acute pathology (TIA/CVA/ACS).  Patient already has established follow-up with a new PCP.  Importance of close follow-up was stressed.  Strict return precautions given and understood.    Wynetta Fines, MD 07/04/20 [phone]

## 2020-07-04 NOTE — ED Notes (Signed)
Patient transported to CT via stretcher in stable condition 

## 2020-09-18 ENCOUNTER — Ambulatory Visit: Payer: Medicaid Other | Admitting: Neurology

## 2020-09-22 ENCOUNTER — Encounter: Payer: Self-pay | Admitting: Neurology

## 2020-09-22 ENCOUNTER — Telehealth: Payer: Self-pay | Admitting: Neurology

## 2020-09-22 ENCOUNTER — Ambulatory Visit: Payer: Medicaid Other | Admitting: Neurology

## 2020-09-22 VITALS — Ht 68.0 in | Wt 200.0 lb

## 2020-09-22 DIAGNOSIS — G43711 Chronic migraine without aura, intractable, with status migrainosus: Secondary | ICD-10-CM | POA: Diagnosis not present

## 2020-09-22 DIAGNOSIS — R42 Dizziness and giddiness: Secondary | ICD-10-CM

## 2020-09-22 DIAGNOSIS — R519 Headache, unspecified: Secondary | ICD-10-CM

## 2020-09-22 DIAGNOSIS — R55 Syncope and collapse: Secondary | ICD-10-CM | POA: Diagnosis not present

## 2020-09-22 DIAGNOSIS — H81399 Other peripheral vertigo, unspecified ear: Secondary | ICD-10-CM

## 2020-09-22 DIAGNOSIS — R0683 Snoring: Secondary | ICD-10-CM

## 2020-09-22 DIAGNOSIS — R270 Ataxia, unspecified: Secondary | ICD-10-CM

## 2020-09-22 MED ORDER — VENLAFAXINE HCL ER 75 MG PO CP24: 75.0000 mg | ORAL_CAPSULE | Freq: Every evening | ORAL | 4 refills | Status: DC | PRN

## 2020-09-22 MED ORDER — EMGALITY 120 MG/ML ~~LOC~~ SOAJ: 120.0000 mg | SUBCUTANEOUS | 11 refills | Status: DC

## 2020-09-22 MED ORDER — UBRELVY 100 MG PO TABS: 100.0000 mg | ORAL_TABLET | ORAL | 11 refills | Status: DC | PRN

## 2020-09-22 NOTE — Telephone Encounter (Signed)
medicaid order sent to GI. They will reach out to the patient to schedule  °
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
## 2020-09-22 NOTE — Patient Instructions (Addendum)
MRI of the brain Physical therapy: for the vertigo Sleep evaluation for sleep apnea Depression: Increase Venlafaxine to 75mg (May also help with headaches) Migraines: Start Emgality for prvention Migraines Ubrelvy as needed: Please take one tablet at the onset of your headache. If it does not improve the symptoms please take one additional tablet. Do not take more then 2 tablets in 24hrs. Do not take use more then 2 to 3 times in a week. Continue daily aspirin     Sleep Apnea Sleep apnea is a condition in which breathing pauses or becomes shallow during sleep. Episodes of sleep apnea usually last 10 seconds or longer, and they may occur as many as 20 times an hour. Sleep apnea disrupts your sleep and keeps your body from getting the rest that it needs. This condition can increase your risk of certain health problems, including:  Heart attack.  Stroke.  Obesity.  Diabetes.  Heart failure.  Irregular heartbeat. What are the causes? There are three kinds of sleep apnea:  Obstructive sleep apnea. This kind is caused by a blocked or collapsed airway.  Central sleep apnea. This kind happens when the part of the brain that controls breathing does not send the correct signals to the muscles that control breathing.  Mixed sleep apnea. This is a combination of obstructive and central sleep apnea. The most common cause of this condition is a collapsed or blocked airway. An airway can collapse or become blocked if:  Your throat muscles are abnormally relaxed.  Your tongue and tonsils are larger than normal.  You are overweight.  Your airway is smaller than normal.   What increases the risk? You are more likely to develop this condition if you:  Are overweight.  Smoke.  Have a smaller than normal airway.  Are elderly.  Are male.  Drink alcohol.  Take sedatives or tranquilizers.  Have a family history of sleep apnea. What are the signs or symptoms? Symptoms of this  condition include:  Trouble staying asleep.  Daytime sleepiness and tiredness.  Irritability.  Loud snoring.  Morning headaches.  Trouble concentrating.  Forgetfulness.  Decreased interest in sex.  Unexplained sleepiness.  Mood swings.  Personality changes.  Feelings of depression.  Waking up often during the night to urinate.  Dry mouth.  Sore throat. How is this diagnosed? This condition may be diagnosed with:  A medical history.  A physical exam.  A series of tests that are done while you are sleeping (sleep study). These tests are usually done in a sleep lab, but they may also be done at home. How is this treated? Treatment for this condition aims to restore normal breathing and to ease symptoms during sleep. It may involve managing health issues that can affect breathing, such as high blood pressure or obesity. Treatment may include:  Sleeping on your side.  Using a decongestant if you have nasal congestion.  Avoiding the use of depressants, including alcohol, sedatives, and narcotics.  Losing weight if you are overweight.  Making changes to your diet.  Quitting smoking.  Using a device to open your airway while you sleep, such as: ? An oral appliance. This is a custom-made mouthpiece that shifts your lower jaw forward. ? A continuous positive airway pressure (CPAP) device. This device blows air through a mask when you breathe out (exhale). ? A nasal expiratory positive airway pressure (EPAP) device. This device has valves that you put into each nostril. ? A bi-level positive airway pressure (BPAP) device. This device  blows air through a mask when you breathe in (inhale) and breathe out (exhale).  Having surgery if other treatments do not work. During surgery, excess tissue is removed to create a wider airway. It is important to get treatment for sleep apnea. Without treatment, this condition can lead to:  High blood pressure.  Coronary artery  disease.  In men, an inability to achieve or maintain an erection (impotence).  Reduced thinking abilities.   Follow these instructions at home: Lifestyle  Make any lifestyle changes that your health care provider recommends.  Eat a healthy, well-balanced diet.  Take steps to lose weight if you are overweight.  Avoid using depressants, including alcohol, sedatives, and narcotics.  Do not use any products that contain nicotine or tobacco, such as cigarettes, e-cigarettes, and chewing tobacco. If you need help quitting, ask your health care provider. General instructions  Take over-the-counter and prescription medicines only as told by your health care provider.  If you were given a device to open your airway while you sleep, use it only as told by your health care provider.  If you are having surgery, make sure to tell your health care provider you have sleep apnea. You may need to bring your device with you.  Keep all follow-up visits as told by your health care provider. This is important. Contact a health care provider if:  The device that you received to open your airway during sleep is uncomfortable or does not seem to be working.  Your symptoms do not improve.  Your symptoms get worse. Get help right away if:  You develop: ? Chest pain. ? Shortness of breath. ? Discomfort in your back, arms, or stomach.  You have: ? Trouble speaking. ? Weakness on one side of your body. ? Drooping in your face. These symptoms may represent a serious problem that is an emergency. Do not wait to see if the symptoms will go away. Get medical help right away. Call your local emergency services (911 in the [place] Do not drive yourself to the hospital. Summary  Sleep apnea is a condition in which breathing pauses or becomes shallow during sleep.  The most common cause is a collapsed or blocked airway.  The goal of treatment is to restore normal breathing and to ease symptoms during  sleep. This information is not intended to replace advice given to you by your health care provider. Make sure you discuss any questions you have with your health care provider. Document Revised: 12/27/2018 Document Reviewed: 03/07/2018 Elsevier Patient Education  2021 Elsevier Inc.  Major Depressive Disorder, Adult Major depressive disorder is a mental health condition. This disorder affects feelings. It can also affect the body. Symptoms of this condition last most of the day, almost every day, for 2 weeks. This disorder can affect:  Relationships.  Daily activities, such as work and school.  Activities that you normally like to do. What are the causes? The cause of this condition is not known. The disorder is likely caused by a mix of things, including:  Your personality, such as being a shy person.  Your behavior, or how you act toward others.  Your thoughts and feelings.  Too much alcohol or drugs.  How you react to stress.  Health and mental problems that you have had for a long time.  Things that hurt you in the past (trauma).  Big changes in your life, such as divorce. What increases the risk? The following factors may make you more likely to  develop this condition:  Having family members with depression.  Being a woman.  Problems in the family.  Low levels of some brain chemicals.  Things that caused you pain as a child, especially if you lost a parent or were abused.  A lot of stress in your life, such as from: ? Living without basic needs of life, such as food and shelter. ? Being treated poorly because of race, sex, or religion (discrimination).  Health and mental problems that you have had for a long time. What are the signs or symptoms? The main symptoms of this condition are:  Being sad all the time.  Being grouchy all the time.  Loss of interest in things and activities. Other symptoms include:  Sleeping too much or too little.  Eating too  much or too little.  Gaining or losing weight, without knowing why.  Feeling tired or having low energy.  Being restless and weak.  Feeling hopeless, worthless, or guilty.  Trouble thinking clearly or making decisions.  Thoughts of hurting yourself or others, or thoughts of ending your life.  Spending a lot of time alone.  Inability to complete common tasks of daily life. If you have very bad MDD, you may:  Believe things that are not true.  Hear, see, taste, or feel things that are not there.  Have mild depression that lasts for at least 2 years.  Feel very sad and hopeless.  Have trouble speaking or moving. How is this treated? This condition may be treated with:  Talk therapy. This teaches you to know bad thoughts, feelings, and actions and how to change them. ? This can also help you to communicate with others. ? This can be done with members of your family.  Medicines. These can be used to treat worry (anxiety), depression, or low levels of chemicals in the brain.  Lifestyle changes. You may need to: ? Limit alcohol use. ? Limit drug use. ? Get regular exercise. ? Get plenty of sleep. ? Make healthy eating choices. ? Spend more time outdoors.  Brain stimulation. This treatment excites the brain. This is done when symptoms are very bad or have not gotten better with other treatments. Follow these instructions at home: Activity  Get regular exercise as told.  Spend time outdoors as told.  Make time to do the things you enjoy.  Find ways to deal with stress. Try to: ? Meditate. ? Do deep breathing. ? Spend time in nature. ? Keep a journal.  Return to your normal activities as told by your doctor. Ask your doctor what activities are safe for you. Alcohol and drug use  If you drink alcohol: ? Limit how much you use to:  0-1 drink a day for women.  0-2 drinks a day for men. ? Be aware of how much alcohol is in your drink. In the [place] one drink  equals one 12 oz bottle of beer (355 mL), one 5 oz glass of wine (148 mL), or one 1 oz glass of hard liquor (44 mL).  Talk to your doctor about: ? Alcohol use. Alcohol can affect some medicines. ? Any drug use. General instructions  Take over-the-counter and prescription medicines and herbal preparations only as told by your doctor.  Eat a healthy diet.  Get a lot of sleep.  Think about joining a support group. Your doctor may be able to suggest one.  Keep all follow-up visits as told by your doctor. This is important.   Where to  find more information:  [employer] on Mental Illness: www.nami.org  [place] [employer] of Mental Health: http://www.maynard.net/  American Psychiatric Association: www.psychiatry.org/patients-families/ Contact a doctor if:  Your symptoms get worse.  You get new symptoms. Get help right away if:  You hurt yourself.  You have serious thoughts about hurting yourself or others.  You see, hear, taste, smell, or feel things that are not there. If you ever feel like you may hurt yourself or others, or have thoughts about taking your own life, get help right away. Go to your nearest emergency department or:  Call your local emergency services (911 in the [place]  Call a suicide crisis helpline, such as the National Suicide Prevention Lifeline at 575-780-0796. This is open 24 hours a day in the [place]  Text the Crisis Text Line at 4703323950 (in the [place] Summary  Major depressive disorder is a mental health condition. This disorder affects feelings. Symptoms of this condition last most of the day, almost every day, for 2 weeks.  The symptoms of this disorder can cause problems with relationships and with daily activities.  There are treatments and support for people who get this disorder. You may need more than one type of treatment.  Get help right away if you have serious thoughts about hurting yourself or others. This information is not  intended to replace advice given to you by your health care provider. Make sure you discuss any questions you have with your health care provider. Document Revised: 06/23/2019 Document Reviewed: 06/23/2019 Elsevier Patient Education  2021 Elsevier Inc.  Migraine Headache A migraine headache is a very strong throbbing pain on one side or both sides of your head. This type of headache can also cause other symptoms. It can last from 4 hours to 3 days. Talk with your doctor about what things may bring on (trigger) this condition. What are the causes? The exact cause of this condition is not known. This condition may be triggered or caused by:  Drinking alcohol.  Smoking.  Taking medicines, such as: ? Medicine used to treat chest pain (nitroglycerin). ? Birth control pills. ? Estrogen. ? Some blood pressure medicines.  Eating or drinking certain products.  Doing physical activity. Other things that may trigger a migraine headache include:  Having a menstrual period.  Pregnancy.  Hunger.  Stress.  Not getting enough sleep or getting too much sleep.  Weather changes.  Tiredness (fatigue). What increases the risk?  Being 26-33 years old.  Being male.  Having a family history of migraine headaches.  Being Caucasian.  Having depression or anxiety.  Being very overweight. What are the signs or symptoms?  A throbbing pain. This pain may: ? Happen in any area of the head, such as on one side or both sides. ? Make it hard to do daily activities. ? Get worse with physical activity. ? Get worse around bright lights or loud noises.  Other symptoms may include: ? Feeling sick to your stomach (nauseous). ? Vomiting. ? Dizziness. ? Being sensitive to bright lights, loud noises, or smells.  Before you get a migraine headache, you may get warning signs (an aura). An aura may include: ? Seeing flashing lights or having blind spots. ? Seeing bright spots, halos, or zigzag  lines. ? Having tunnel vision or blurred vision. ? Having numbness or a tingling feeling. ? Having trouble talking. ? Having weak muscles.  Some people have symptoms after a migraine headache (postdromal phase), such as: ? Tiredness. ? Trouble  thinking (concentrating). How is this treated?  Taking medicines that: ? Relieve pain. ? Relieve the feeling of being sick to your stomach. ? Prevent migraine headaches.  Treatment may also include: ? Having acupuncture. ? Avoiding foods that bring on migraine headaches. ? Learning ways to control your body functions (biofeedback). ? Therapy to help you know and deal with negative thoughts (cognitive behavioral therapy). Follow these instructions at home: Medicines  Take over-the-counter and prescription medicines only as told by your doctor.  Ask your doctor if the medicine prescribed to you: ? Requires you to avoid driving or using heavy machinery. ? Can cause trouble pooping (constipation). You may need to take these steps to prevent or treat trouble pooping:  Drink enough fluid to keep your pee (urine) pale yellow.  Take over-the-counter or prescription medicines.  Eat foods that are high in fiber. These include beans, whole grains, and fresh fruits and vegetables.  Limit foods that are high in fat and sugar. These include fried or sweet foods. Lifestyle  Do not drink alcohol.  Do not use any products that contain nicotine or tobacco, such as cigarettes, e-cigarettes, and chewing tobacco. If you need help quitting, ask your doctor.  Get at least 8 hours of sleep every night.  Limit and deal with stress. General instructions  Keep a journal to find out what may bring on your migraine headaches. For example, write down: ? What you eat and drink. ? How much sleep you get. ? Any change in what you eat or drink. ? Any change in your medicines.  If you have a migraine headache: ? Avoid things that make your symptoms worse,  such as bright lights. ? It may help to lie down in a dark, quiet room. ? Do not drive or use heavy machinery. ? Ask your doctor what activities are safe for you.  Keep all follow-up visits as told by your doctor. This is important.      Contact a doctor if:  You get a migraine headache that is different or worse than others you have had.  You have more than 15 headache days in one month. Get help right away if:  Your migraine headache gets very bad.  Your migraine headache lasts longer than 72 hours.  You have a fever.  You have a stiff neck.  You have trouble seeing.  Your muscles feel weak or like you cannot control them.  You start to lose your balance a lot.  You start to have trouble walking.  You pass out (faint).  You have a seizure. Summary  A migraine headache is a very strong throbbing pain on one side or both sides of your head. These headaches can also cause other symptoms.  This condition may be treated with medicines and changes to your lifestyle.  Keep a journal to find out what may bring on your migraine headaches.  Contact a doctor if you get a migraine headache that is different or worse than others you have had.  Contact your doctor if you have more than 15 headache days in a month. This information is not intended to replace advice given to you by your health care provider. Make sure you discuss any questions you have with your health care provider. Document Revised: 11/03/2018 Document Reviewed: 08/24/2018 Elsevier Patient Education  2021 [employer].

## 2020-09-22 NOTE — Progress Notes (Signed)
Patient was educated on how to administer Emgality injection and was provided with the instructions to take home for reference. Pt aware of administration sites and proper skin prep.  Patient was given to Emgality 120 mg injections today for loading dose in his upper arms.  Pt tolerated the injections well. Pt is aware the next injection will be 120 mg due on March 28.  They are every 30 days.  Pt's questions were answered.  

## 2020-09-23 ENCOUNTER — Telehealth: Payer: Self-pay | Admitting: *Deleted

## 2020-09-23 NOTE — Telephone Encounter (Signed)
Completed Emgality PA on Cover My Meds. [employer]. Awaiting determination from [facility] [employer].

## 2020-09-24 ENCOUNTER — Telehealth: Payer: Self-pay | Admitting: *Deleted

## 2020-09-24 NOTE — Telephone Encounter (Signed)
[place] [employer] [place] form completed, signed, and faxed to [employer]. Received a receipt of confirmation.

## 2020-09-29 ENCOUNTER — Other Ambulatory Visit: Payer: Self-pay

## 2020-09-29 ENCOUNTER — Ambulatory Visit
Admission: RE | Admit: 2020-09-29 | Discharge: 2020-09-29 | Disposition: A | Discharge status: Home / Self Care | Payer: Medicaid Other | Source: Ambulatory Visit | Attending: Neurology | Admitting: Neurology

## 2020-09-29 DIAGNOSIS — R55 Syncope and collapse: Secondary | ICD-10-CM

## 2020-09-29 DIAGNOSIS — R42 Dizziness and giddiness: Secondary | ICD-10-CM

## 2020-09-29 DIAGNOSIS — H81399 Other peripheral vertigo, unspecified ear: Secondary | ICD-10-CM

## 2020-09-29 DIAGNOSIS — R270 Ataxia, unspecified: Secondary | ICD-10-CM

## 2020-09-30 NOTE — Telephone Encounter (Signed)
Per [place] [employer] portal, Emgality denied. No reason specified. Confirmation #: [id number]

## 2020-09-30 NOTE — Telephone Encounter (Signed)
Per Cover My Meds portal, Bernita Raisin has been denied. No reason specified. Confirmation #: [id number]

## 2020-10-22 ENCOUNTER — Ambulatory Visit: Payer: Medicaid Other | Admitting: Neurology

## 2020-10-22 ENCOUNTER — Encounter: Payer: Self-pay | Admitting: Neurology

## 2020-10-22 VITALS — BP 129/71 | HR 86 | Ht 68.5 in | Wt 195.0 lb

## 2020-10-22 DIAGNOSIS — G43711 Chronic migraine without aura, intractable, with status migrainosus: Secondary | ICD-10-CM | POA: Diagnosis not present

## 2020-10-22 DIAGNOSIS — R0683 Snoring: Secondary | ICD-10-CM | POA: Diagnosis not present

## 2020-10-22 DIAGNOSIS — G478 Other sleep disorders: Secondary | ICD-10-CM | POA: Insufficient documentation

## 2020-10-22 DIAGNOSIS — G4719 Other hypersomnia: Secondary | ICD-10-CM

## 2020-10-22 DIAGNOSIS — R519 Headache, unspecified: Secondary | ICD-10-CM

## 2020-10-22 DIAGNOSIS — J439 Emphysema, unspecified: Secondary | ICD-10-CM

## 2020-10-22 NOTE — Patient Instructions (Signed)
After spending a total time of 45 minutes face to face and additional time for physical and neurologic examination, review of laboratory studies,  personal review of imaging studies, reports and results of other testing and review of referral information / records as far as provided in visit, I have established the following assessments:  1) history of hypersomnia in childhood, and seep walking.  2) now depression related changes in sleep- some hypersomnia with excessive sleepiness for many hours, and other days insomnia. He reports vivid dreams, in installments, he is able to return to the same dream, but the dreams feel very real to him, he may wake up from dreaming, palpitation, no diaphoresis  3) loud snoring, and witnessed apneas per GF. 4) poor sleep hygiene, no routines.    My Plan is to proceed with:  1) screening for sleep apnea.  By HST.  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  Today, VIt D, B12 and CMET.  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    Quality Sleep Information, Adult Quality sleep is important for your mental and physical health. It also improves your quality of life. Quality sleep means you:  Are asleep for most of the time you are in bed.  Fall asleep within 30 minutes.  Wake up no more than once a night.  Are awake for no longer than 20 minutes if you do wake up during the night. Most adults need 7-8 hours of quality sleep each night. How can poor sleep affect me? If you do not get enough quality sleep, you may have:  Mood swings.  Daytime sleepiness.  Confusion.  Decreased reaction time.  Sleep disorders, such as insomnia and sleep apnea.  Difficulty with: ? Solving problems. ? Coping with stress. ? Paying attention. These issues may affect your performance and productivity at work, school, and at home. Lack of sleep may also put you at higher risk for accidents, suicide, and risky behaviors. If you do not get quality sleep you  may also be at higher risk for several health problems, including:  Infections.  Type 2 diabetes.  Heart disease.  High blood pressure.  Obesity.  Worsening of long-term conditions, like arthritis, kidney disease, depression, Parkinson's disease, and epilepsy. What actions can I take to get more quality sleep?  Stick to a sleep schedule. Go to sleep and wake up at about the same time each day. Do not try to sleep less on weekdays and make up for lost sleep on weekends. This does not work.  Try to get about 30 minutes of exercise on most days. Do not exercise 2-3 hours before going to bed.  Limit naps during the day to 30 minutes or less.  Do not use any products that contain nicotine or tobacco, such as cigarettes or e-cigarettes. If you need help quitting, ask your health care provider.  Do not drink caffeinated beverages for at least 8 hours before going to bed. Coffee, tea, and some sodas contain caffeine.  Do not drink alcohol close to bedtime.  Do not eat large meals close to bedtime.  Do not take naps in the late afternoon.  Try to get at least 30 minutes of sunlight every day. Morning sunlight is best.  Make time to relax before bed. Reading, listening to music, or taking a hot bath promotes quality sleep.  Make your bedroom a place that promotes quality sleep. Keep your bedroom dark, quiet, and at a comfortable room temperature. Make sure your bed is comfortable.  Take out sleep distractions like TV, a computer, smartphone, and bright lights.  If you are lying awake in bed for longer than 20 minutes, get up and do a relaxing activity until you feel sleepy.  Work with your health care provider to treat medical conditions that may affect sleeping, such as: ? Nasal obstruction. ? Snoring. ? Sleep apnea and other sleep disorders.  Talk to your health care provider if you think any of your prescription medicines may cause you to have difficulty falling or staying  asleep.  If you have sleep problems, talk with a sleep consultant. If you think you have a sleep disorder, talk with your health care provider about getting evaluated by a specialist.      Where to find more information  [place] website: https://sleepfoundation.org  National Heart, Lung, and [place] ([place]): http://www.saunders.info/.pdf  Centers for Disease Control and Prevention (CDC): LearningDermatology.pl Contact a health care provider if you:  Have trouble getting to sleep or staying asleep.  Often wake up very early in the morning and cannot get back to sleep.  Have daytime sleepiness.  Have daytime sleep attacks of suddenly falling asleep and sudden muscle weakness (narcolepsy).  Have a tingling sensation in your legs with a strong urge to move your legs (restless legs syndrome).  Stop breathing briefly during sleep (sleep apnea).  Think you have a sleep disorder or are taking a medicine that is affecting your quality of sleep. Summary  Most adults need 7-8 hours of quality sleep each night.  Getting enough quality sleep is an important part of health and well-being.  Make your bedroom a place that promotes quality sleep and avoid things that may cause you to have poor sleep, such as alcohol, caffeine, smoking, and large meals.  Talk to your health care provider if you have trouble falling asleep or staying asleep. This information is not intended to replace advice given to you by your health care provider. Make sure you discuss any questions you have with your health care provider. Document Revised: 10/19/2017 Document Reviewed: 10/19/2017 Elsevier Patient Education  2021 [place].     Narcolepsy Narcolepsy is a neurological disorder that causes people to fall asleep suddenly and without control (have sleep attacks) during the daytime. It is a lifelong disorder. Narcolepsy disrupts the sleep cycle  at night, which then causes daytime sleepiness. What are the causes? The cause of narcolepsy is not fully understood, but it may be related to:  Low levels of hypocretin, a chemical (neurotransmitter) in the brain that controls sleep and wake cycles. Hypocretin imbalance may be caused by: ? Abnormal genes that are passed from parent to child (inherited). ? An autoimmune disease in which the body's defense system (immune system) attacks the brain cells that make hypocretin.  Infection, tumor, or injury in the area of the brain that controls sleep.  Exposure to poisons (toxins), such as heavy metals, pesticides, and secondhand smoke. What are the signs or symptoms? Symptoms of this condition include:  Excessive daytime sleepiness. This is the most common symptom and is usually the first symptom you will notice. This may affect your performance at work or school.  Sleep attacks. You may fall asleep in the middle of an activity, especially low-energy activities like reading or watching TV.  Feeling like you cannot think clearly and trouble focusing or remembering things. You may also feel depressed.  Sudden muscle weakness (cataplexy). When this occurs, your speech may become slurred, or your knees may  buckle. Cataplexy is usually triggered by surprise, anger, fear, or laughter.  Losing the ability to speak or move (sleep paralysis). This may occur just as you start to fall asleep or wake up. You will be aware of the paralysis. It usually lasts for just a few seconds or minutes.  Seeing, hearing, tasting, smelling, or feeling things that are not real (hallucinations). Hallucinations may occur with sleep paralysis. They can happen when you are falling asleep, waking up, or dozing.  Trouble staying asleep at night (insomnia) and restless sleep. How is this diagnosed? This condition may be diagnosed based on:  A physical exam to rule out any other problems that may be causing your symptoms.  You may be asked to write down your sleeping patterns for several weeks in a sleep diary. This will help your health care provider make a diagnosis.  Sleep studies that measure how well your REM sleep is regulated. These tests also measure your heart rate, breathing, movement, and brain waves. These tests include: ? An overnight sleep study (polysomnogram). ? A daytime sleep study that is done while you take several naps during the day (multiple sleep latency test, MSLT). This test measures how quickly you fall asleep and how quickly you enter REM sleep.  Removal of spinal fluid to measure hypocretin levels. How is this treated? There is no cure for this condition, but treatment can help relieve symptoms. Treatment may include:  Lifestyle and sleeping strategies to help you cope with the condition, such as: ? Exercising regularly. ? Maintaining a regular sleep schedule. ? Avoiding caffeine and large meals before bed.  Medicines. These may include: ? Medicines that help keep you awake and alert (stimulants) to fight daytime sleepiness. ? Medicines that treat depression (antidepressants). These may be used to treat cataplexy. ? Sodium oxybate. This is a strong medicine to help you relax (sedative) that you may take at night. It can help control daytime sleepiness and cataplexy.  Other treatments may include mental health counseling or joining a support group. Follow these instructions at home: Sleeping habits  Get about 8 hours of sleep every night.  Go to sleep and get up at about the same time every day.  Keep your bedroom dark, quiet, and comfortable.  When you feel very tired, take short naps. Schedule naps so that you take them at about the same time every day.  Before bedtime: ? Avoid bright lights and screens. ? Relax. Try activities like reading or taking a warm bath.   Activity  Get at least 20 minutes of exercise every day. This will help you sleep better at night and  reduce daytime sleepiness.  Avoid exercising within 3 hours of bedtime.  Do not drive or use heavy machinery if you are sleepy. If possible, take a nap before driving.  Do not swim or go out on the water without a life jacket. Eating and drinking  Do not drink alcohol or caffeinated beverages within 4-5 hours of bedtime.  Do not eat a large meal before bedtime.  Eat meals at about the same times every day. General instructions  Take over-the-counter and prescription medicines only as told by your health care provider.  Keep a sleep diary as told by your health care provider.  Tell your employer or teachers that you have narcolepsy. You may be able to adjust your schedule to include time for naps.  Do not use any products that contain nicotine or tobacco, such as cigarettes, e-cigarettes, and chewing tobacco.  If you need help quitting, ask your health care provider.  Keep all follow-up visits as told by your health care provider. This is important.   Where to find more information  [employer] of Neurological Disorders: MasterBoxes.it Contact a health care provider if:  Your symptoms are not getting better.  You have increasingly high blood pressure (hypertension).  You have changes in your heart rhythm.  You are having a hard time determining what is real and what is not (psychosis). Get help right away if you:  Hurt yourself during a sleep attack or an attack of cataplexy.  Have chest pain.  Have trouble breathing. These symptoms may represent a serious problem that is an emergency. Do not wait to see if the symptoms will go away. Get medical help right away. Call your local emergency services (911 in the [place] Do not drive yourself to the hospital. Summary  Narcolepsy is a neurological disorder that causes people to fall asleep suddenly, and without control, during the daytime (sleep attacks). It is a lifelong disorder.  There is no cure for this  condition, but treatment can help relieve symptoms.  Go to sleep and get up at about the same time every day. Follow instructions about sleep and activities as told by your health care provider.  Take over-the-counter and prescription medicines only as told by your health care provider. This information is not intended to replace advice given to you by your health care provider. Make sure you discuss any questions you have with your health care provider. Document Revised: 02/21/2019 Document Reviewed: 02/21/2019 Elsevier Patient Education  2021 [place]. Hypersomnia Hypersomnia is a condition in which a person feels very tired during the day even though he or she gets plenty of sleep at night. A person with this condition may take naps during the day and may find it very difficult to wake up from sleep. Hypersomnia may affect a person's ability to think, concentrate, drive, or remember things. What are the causes? The cause of this condition may not be known. Possible causes include:  Certain medicines.  Sleep disorders, such as narcolepsy and sleep apnea.  Injury to the head, brain, or spinal cord.  Drug or alcohol use.  Gastroesophageal reflux disease (GERD).  Tumors.  Certain medical conditions, such as depression, diabetes, or an underactive thyroid gland (hypothyroidism). What are the signs or symptoms? The main symptoms of hypersomnia include:  Feeling very tired throughout the day, regardless of how much sleep you got the night before.  Having trouble waking up. Others may find it difficult to wake you up when you are sleeping.  Sleeping for longer and longer periods at a time.  Taking naps throughout the day. Other symptoms may include:  Feeling restless, anxious, or annoyed.  Lacking energy.  Having trouble with: ? Remembering. ? Speaking. ? Thinking.  Loss of appetite.  Seeing, hearing, tasting, smelling, or feeling things that are not real  (hallucinations). How is this diagnosed? This condition may be diagnosed based on:  Your symptoms and medical history.  Your sleeping habits. Your health care provider may ask you to write down your sleeping habits in a daily sleep log, along with any symptoms you have.  A series of tests that are done while you sleep (sleep study or polysomnogram).  A test that measures how quickly you can fall asleep during the day (daytime nap study or multiple sleep latency test). How is this treated? Treatment can help you manage your condition. Treatment  may include:  Following a regular sleep routine.  Lifestyle changes, such as changing your eating habits, getting regular exercise, and avoiding alcohol or caffeinated beverages.  Taking medicines to make you more alert (stimulants) during the day.  Treating any underlying medical causes of hypersomnia. Follow these instructions at home: Sleep routine  Schedule the same bedtime and wake-up time each day.  Practice a relaxing bedtime routine. This may include reading, meditation, deep breathing, or taking a warm bath before going to sleep.  Get regular exercise each day. Avoid strenuous exercise in the evening hours.  Keep your sleep environment at a cooler temperature, darkened, and quiet.  Sleep with pillows and a mattress that are comfortable and supportive.  Schedule short 20-minute naps for when you feel sleepiest during the day.  Talk with your employer or teachers about your hypersomnia. If possible, adjust your schedule so that: ? You have a regular daytime work schedule. ? You can take a scheduled nap during the day. ? You do not have to work or be active at night.  Do not eat a heavy meal for a few hours before bedtime. Eat your meals at about the same times every day.  Avoid drinking alcohol or caffeinated beverages.   Safety  Do not drive or use heavy machinery if you are sleepy. Ask your health care provider if it is  safe for you to drive.  Wear a life jacket when swimming or spending time near water.   General instructions  Take supplements and over-the-counter and prescription medicines only as told by your health care provider.  Keep a sleep log that will help your doctor manage your condition. This may include information about: ? What time you go to bed each night. ? How often you wake up at night. ? How many hours you sleep at night. ? How often and for how long you nap during the day. ? Any observations from others, such as leg movements during sleep, sleep walking, or snoring.  Keep all follow-up visits as told by your health care provider. This is important. Contact a health care provider if:  You have new symptoms.  Your symptoms get worse. Get help right away if:  You have serious thoughts about hurting yourself or someone else. If you ever feel like you may hurt yourself or others, or have thoughts about taking your own life, get help right away. You can go to your nearest emergency department or call:  Your local emergency services (911 in the [place]  A suicide crisis helpline, such as the [place] at 360-229-3018. This is open 24 hours a day. Summary  Hypersomnia refers to a condition in which you feel very tired during the day even though you get plenty of sleep at night.  A person with this condition may take naps during the day and may find it very difficult to wake up from sleep.  Hypersomnia may affect a person's ability to think, concentrate, drive, or remember things.  Treatment, such as following a regular sleep routine and making some lifestyle changes, can help you manage your condition. This information is not intended to replace advice given to you by your health care provider. Make sure you discuss any questions you have with your health care provider. Document Revised: 05/22/2020 Document Reviewed: 05/22/2020 Elsevier Patient  Education  2021 [employer].
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
## 2020-10-23 ENCOUNTER — Telehealth: Payer: Self-pay

## 2020-10-23 DIAGNOSIS — R519 Headache, unspecified: Secondary | ICD-10-CM

## 2020-10-23 DIAGNOSIS — G43711 Chronic migraine without aura, intractable, with status migrainosus: Secondary | ICD-10-CM

## 2020-10-23 DIAGNOSIS — J431 Panlobular emphysema: Secondary | ICD-10-CM

## 2020-10-23 DIAGNOSIS — G4734 Idiopathic sleep related nonobstructive alveolar hypoventilation: Secondary | ICD-10-CM

## 2020-10-23 DIAGNOSIS — G4719 Other hypersomnia: Secondary | ICD-10-CM

## 2020-10-23 NOTE — Telephone Encounter (Signed)
Spoke with Engineer, manufacturing systems at [facility] tracks. Discussed the Ubrelvy. It got denied because patient cannot have 15 or more headache days per month. Only the patient can appeal this decision or another PA can be done.

## 2020-10-23 NOTE — Telephone Encounter (Signed)
Traditional medicaid will not cover HST. Will nned an in lab sleep study order.

## 2020-10-23 NOTE — Progress Notes (Signed)
Very high glucose level, non fasting. Normal renal and hepatic function. All other tests are still pending.

## 2020-10-23 NOTE — Telephone Encounter (Signed)
I called the patient and LVM asking for call back.  When the patient calls back, please let him know the pharmacy is ordering the Emgality injection for his migraines and it will be at the pharmacy tomorrow for him to pick up. Also let him know the Bernita Raisin that was prescribed has been denied by Medicaid.

## 2020-10-23 NOTE — Telephone Encounter (Signed)
Medicaid denied HST, will change to  In lab sleep testing. CD

## 2020-10-23 NOTE — Telephone Encounter (Signed)
LVM for pt to call me back to schedule sleep study  

## 2020-10-23 NOTE — Telephone Encounter (Addendum)
Spoke with Engineer, manufacturing systems at [facility] tracks. Discussed the Ubrelvy. It got denied because patient cannot have 15 or more headache days per month. Only the patient can appeal this decision or another PA can be done. It was not clear why the Emgality got denied. Pt has chronic migraines and has tried and failed multiple preventive medications. I was told another PA could be attempted. I completed this on the portal and it was approved.   Confirmation #: [id number] Prior Approval #: [id number] Status: APPROVED  [facility] was able to process the Emgality claim but will order the medication and have it there tomorrow for the patient.

## 2020-10-24 NOTE — Addendum Note (Signed)
Addended by: Judi Cong on: 10/24/2020 07:43 AM   Modules accepted: Orders

## 2020-10-24 NOTE — Telephone Encounter (Signed)
He has an appointment with Amy Lomax in a few months, in the meantime let's see how the Emgality works and highly encourage him to follow up for the consult on his sleep apnea - if he has sleep apnea, nothing will fix his headaches if we don;t address the sleep apnea he needs to be evaluated for that before he comes back for follow up thanks

## 2020-10-28 NOTE — Telephone Encounter (Signed)
I spoke with the patient. He saw Dr Vickey Huger on 10/22/20. Labs pending. He already injected his next dose of Emgality (received from pharmacy) is aware this is due every 30 days. Will follow up in June with Amy to see how he is doing. Pt verbalized appreciation for the call and his questions were answered. He is aware the glucose level was high on labs, but liver and renal function normal, other labs pending.

## 2020-11-04 LAB — COMPREHENSIVE METABOLIC PANEL
ALT: 20 IU/L (ref 0–44)
AST: 16 IU/L (ref 0–40)
Albumin/Globulin Ratio: 1.5 (ref 1.2–2.2)
Albumin: 4.2 g/dL (ref 3.8–4.9)
Alkaline Phosphatase: 112 IU/L (ref 44–121)
BUN/Creatinine Ratio: 20 (ref 9–20)
BUN: 17 mg/dL (ref 6–24)
Bilirubin Total: 0.4 mg/dL (ref 0.0–1.2)
CO2: 24 mmol/L (ref 20–29)
Calcium: 9.2 mg/dL (ref 8.7–10.2)
Chloride: 99 mmol/L (ref 96–106)
Creatinine, Ser: 0.84 mg/dL (ref 0.76–1.27)
Globulin, Total: 2.8 g/dL (ref 1.5–4.5)
Glucose: 169 mg/dL — ABNORMAL HIGH (ref 65–99)
Potassium: 4.6 mmol/L (ref 3.5–5.2)
Sodium: 139 mmol/L (ref 134–144)
Total Protein: 7 g/dL (ref 6.0–8.5)
eGFR: 106 mL/min/{1.73_m2} (ref >59)

## 2020-11-04 LAB — NARCOLEPSY EVALUATION
DQA1*01:02: NEGATIVE
DQB1*06:02: NEGATIVE

## 2020-11-04 LAB — VITAMIN D 1,25 DIHYDROXY
Vitamin D 1, 25 (OH)2 Total: 58 pg/mL
Vitamin D2 1, 25 (OH)2: 12 pg/mL
Vitamin D3 1, 25 (OH)2: 46 pg/mL

## 2020-11-05 NOTE — Progress Notes (Signed)
Matthew Fowler did not show the HLA marker for narcolepsy.

## 2020-11-06 ENCOUNTER — Telehealth: Payer: Self-pay

## 2020-11-06 NOTE — Telephone Encounter (Signed)
LVM for pt to call me back to schedule sleep study  

## 2020-11-10 ENCOUNTER — Telehealth: Payer: Self-pay | Admitting: Neurology

## 2020-11-10 NOTE — Telephone Encounter (Signed)
Called the patient. There was no answer.  Unable to leave a detailed message but did advised the patient to call the office back.  **IF pt returns call please advise we were calling with lab results. Inform the patient that the lab test that Dr Dohmeier ordered to see if he carries the gene for narcolepsy came back and Dr Dohmeier reviewed. It was negative in both strands. This doesn't mean he don't have narcolepsy only means he does not carry the gene for it. We will proceed forward with the sleep study.

## 2020-11-10 NOTE — Telephone Encounter (Signed)
Pt returned phone call. Informed Pt of previous note as instructed. Pt verbally understood

## 2020-11-10 NOTE — Telephone Encounter (Signed)
-----  Message from Larey Seat, MD sent at 11/05/2020  3:50 PM EDT ----- Matthew Fowler did not show the HLA marker for narcolepsy.

## 2020-11-27 ENCOUNTER — Ambulatory Visit (INDEPENDENT_AMBULATORY_CARE_PROVIDER_SITE_OTHER): Payer: Medicaid Other | Admitting: Neurology

## 2020-11-27 ENCOUNTER — Other Ambulatory Visit: Payer: Self-pay

## 2020-11-27 DIAGNOSIS — G4733 Obstructive sleep apnea (adult) (pediatric): Secondary | ICD-10-CM | POA: Diagnosis not present

## 2020-11-27 DIAGNOSIS — J431 Panlobular emphysema: Secondary | ICD-10-CM

## 2020-11-27 DIAGNOSIS — G43711 Chronic migraine without aura, intractable, with status migrainosus: Secondary | ICD-10-CM

## 2020-11-27 DIAGNOSIS — G4719 Other hypersomnia: Secondary | ICD-10-CM

## 2020-11-27 DIAGNOSIS — R519 Headache, unspecified: Secondary | ICD-10-CM

## 2020-11-27 DIAGNOSIS — G4734 Idiopathic sleep related nonobstructive alveolar hypoventilation: Secondary | ICD-10-CM

## 2020-12-11 ENCOUNTER — Telehealth: Payer: Self-pay | Admitting: Neurology

## 2020-12-11 NOTE — Progress Notes (Signed)
IMPRESSION:  1. Mostly Obstructive Sleep Apnea (OSA) at AHI 25.5/h with supine sleep dependent apnea, snoring.  2. Sleep was very fragmented, and no early REM sleep onset was noted.    RECOMMENDATIONS:  1. Advise either full night, attended, CPAP titration study to optimize therapy or auto CPAP at 6-16 cm water, 2 cm EPR and mask of patient choice with heated humidification.  2. Avoid supine sleep -using a tennis ball, body pillow, etc.

## 2020-12-11 NOTE — Addendum Note (Signed)
Addended by: Melvyn Novas on: 12/11/2020 09:28 AM   Modules accepted: Orders

## 2020-12-11 NOTE — Procedures (Signed)
PATIENT'S NAME:  Matthew Fowler DOB:      June 01, 1971      MR#:    [id number]     DATE OF RECORDING: 11/27/2020 M. R> REFERRING M.D.:  Matthew Dean MD Study Performed:   Baseline Polysomnogram HISTORY:  Matthew Fowler is a 50 y.o. year old [place]- [place] male patient who had been seen at the sleep clinic upon referral on 10/22/2020 from Matthew Fowler. Chief concern: " I was always a sleepier child than others, I always fell asleep in a car, in the classroom".  He has a medical history of Diabetes mellitus type 2 (adult onset) with complication ([facility]), non insulin dependent, High cholesterol, Ankle edema, " weak heart " Hypertension, shortness of breath, emphysema, COPD, and Migraine.  Sleep relevant medical history: Sleep walking in childhood up to age 49, deviated septum. Family medical /sleep history: no other family member on CPAP with OSA- His mother gave him up at 29 month of age and he was raised by maternal grandmother and father and an uncle. He did not attend school continuously, and is unable to read, write.   The patient endorsed the Epworth Sleepiness Scale at 15 points.   The patient's weight 195 pounds with a height of 68 (inches), resulting in a BMI of 29.1 kg/m2. The patient's neck circumference measured 17 inches.  CURRENT MEDICATIONS: Excedrin Migraine, Emgality, Zestril, Glucophage, ProAir HFA, Crestor, Symbicort, Ubrelvy, Effexor-XR   PROCEDURE:  This is a multichannel digital polysomnogram utilizing the Somnostar 11.2 system.  Electrodes and sensors were applied and monitored per AASM Specifications.   EEG, EOG, Chin and Limb EMG, were sampled at 200 Hz.  ECG, Snore and Nasal Pressure, Thermal Airflow, Respiratory Effort, CPAP Flow and Pressure, Oximetry was sampled at 50 Hz. Digital video and audio were recorded.      BASELINE STUDY: Lights Out was at 20:38 and Lights On at 04:58.  Total recording time (TRT) was 501 minutes, with a total sleep time (TST) of 447 minutes.   The patient's  sleep latency was 24.5 minutes.  REM latency was 156 minutes.  The sleep efficiency was 89.2 %.     SLEEP ARCHITECTURE: WASO (Wake after sleep onset) was 45 minutes. There were 53 minutes in Stage N1, 331 minutes Stage N2, 12.5 minutes Stage N3 and 50.5 minutes in Stage REM.  The percentage of Stage N1 was 11.9%, Stage N2 was 74.%, Stage N3 was 2.8% and Stage R (REM sleep) was 11.3%.    RESPIRATORY ANALYSIS:  There were a total of 190 respiratory events:  148 obstructive apneas, 5 central apneas and 5 mixed apneas with 32 hypopneas .Marland Kitchen The patient also had several respiratory event related arousals (RERAs).      The total APNEA/HYPOPNEA INDEX (AHI) was 25.5/hour.  13 events occurred in REM sleep and 59 events in NREM. The REM AHI was 15.4 /hour, versus a non-REM AHI of 26.8. The patient spent 135 minutes of total sleep time in the supine position and 312 minutes in non-supine. The supine AHI was 67.5/h versus a non-supine AHI of 7.3.  OXYGEN SATURATION & C02:  The Wake baseline 02 saturation was 96%, with the lowest being 85%. Time spent below 89% saturation equaled 12 minutes.  The arousals were noted as: 78 were spontaneous, 0 were associated with PLMs, 135 were associated with respiratory events. The patient had a total of 0 Periodic Limb Movements.   Audio and video analysis did not show any abnormal or unusual movements, behaviors,  phonations or vocalizations.   Snoring was noted. EKG was in keeping with normal sinus rhythm (NSR).  IMPRESSION:  1. Mostly Obstructive Sleep Apnea (OSA) at AHI 25.5/h with supine sleep dependent apnea, snoring.  2. Sleep was very fragmented, and no early REM sleep onset was noted.    RECOMMENDATIONS:  1. Advise either full night, attended, CPAP titration study to optimize therapy or auto CPAP at 6-16 cm water, 2 cm EPR and mask of patient choice with heated humidification.  2. Avoid supine sleep -using a tennis ball, body pillow, etc.     I certify that  I have reviewed the entire raw data recording prior to the issuance of this report in accordance with the Standards of Accreditation of the American Academy of Sleep Medicine (AASM)   Melvyn Novas, MD Diplomat, American Board of Psychiatry and Neurology  Diplomat, American Board of Sleep Medicine Wellsite geologist, [place] [facility] [employer]

## 2020-12-11 NOTE — Telephone Encounter (Signed)
-----   Message from Melvyn Novas, MD sent at 12/11/2020  9:28 AM EDT ----- IMPRESSION:  1. Mostly Obstructive Sleep Apnea (OSA) at AHI 25.5/h with supine sleep dependent apnea, snoring.  2. Sleep was very fragmented, and no early REM sleep onset was noted.    RECOMMENDATIONS:  1. Advise either full night, attended, CPAP titration study to optimize therapy or auto CPAP at 6-16 cm water, 2 cm EPR and mask of patient choice with heated humidification.  2. Avoid supine sleep -using a tennis ball, body pillow, etc.

## 2020-12-11 NOTE — Telephone Encounter (Signed)
Called patient to discuss sleep study results. No answer at this time. LVM for the patient to call back.   

## 2020-12-15 ENCOUNTER — Telehealth: Payer: Self-pay | Admitting: Neurology

## 2020-12-15 NOTE — Telephone Encounter (Signed)
-----   Message from Carmen Dohmeier, MD sent at 12/11/2020  9:28 AM EDT ----- IMPRESSION:  1. Mostly Obstructive Sleep Apnea (OSA) at AHI 25.5/h with supine sleep dependent apnea, snoring.  2. Sleep was very fragmented, and no early REM sleep onset was noted.    RECOMMENDATIONS:  1. Advise either full night, attended, CPAP titration study to optimize therapy or auto CPAP at 6-16 cm water, 2 cm EPR and mask of patient choice with heated humidification.  2. Avoid supine sleep -using a tennis ball, body pillow, etc.    

## 2020-12-15 NOTE — Telephone Encounter (Signed)
Called patient to discuss sleep study results. No answer at this time. LVM for the patient to call back.   

## 2020-12-16 ENCOUNTER — Telehealth: Payer: Self-pay | Admitting: Neurology

## 2020-12-16 NOTE — Telephone Encounter (Signed)
Called the patient. Glad he came in and updated phone number because we had attempted twice to call the patient.   I advised pt that Dr. Vickey Huger reviewed their sleep study results and found that has moderate OSA and recommends that pt be treated with a cpap. Dr. Vickey Huger recommends that pt return for a repeat sleep study in order to properly titrate the cpap and ensure a good mask fit. Pt is agreeable to returning for a titration study. I advised pt that our sleep lab will file with pt's insurance and call pt to schedule the sleep study when we hear back from the pt's insurance regarding coverage of this sleep study. Pt verbalized understanding of results. Pt had no questions at this time but was encouraged to call back if questions arise.

## 2020-12-16 NOTE — Telephone Encounter (Signed)
Matthew Fowler, Patient came into the lobby to update his phone number. He had sleep study done 5/5 and wanted to discuss the results. Can someone give him a call to review or let him know if he needs to do a FU. Thank you

## 2021-01-01 ENCOUNTER — Emergency Department (HOSPITAL_COMMUNITY): Payer: Medicaid Other

## 2021-01-01 ENCOUNTER — Encounter (HOSPITAL_COMMUNITY): Payer: Self-pay | Admitting: Emergency Medicine

## 2021-01-01 ENCOUNTER — Emergency Department (HOSPITAL_COMMUNITY)
Admission: EM | Admit: 2021-01-01 | Discharge: 2021-01-01 | Disposition: A | Discharge status: Home / Self Care | Payer: Medicaid Other | Attending: Emergency Medicine | Admitting: Emergency Medicine

## 2021-01-01 DIAGNOSIS — Z79899 Other long term (current) drug therapy: Secondary | ICD-10-CM | POA: Insufficient documentation

## 2021-01-01 DIAGNOSIS — M79641 Pain in right hand: Secondary | ICD-10-CM

## 2021-01-01 DIAGNOSIS — Z7984 Long term (current) use of oral hypoglycemic drugs: Secondary | ICD-10-CM | POA: Insufficient documentation

## 2021-01-01 DIAGNOSIS — E119 Type 2 diabetes mellitus without complications: Secondary | ICD-10-CM | POA: Insufficient documentation

## 2021-01-01 DIAGNOSIS — I10 Essential (primary) hypertension: Secondary | ICD-10-CM | POA: Diagnosis not present

## 2021-01-01 DIAGNOSIS — Z87891 Personal history of nicotine dependence: Secondary | ICD-10-CM | POA: Diagnosis not present

## 2021-01-01 DIAGNOSIS — Z7982 Long term (current) use of aspirin: Secondary | ICD-10-CM | POA: Insufficient documentation

## 2021-01-01 MED ORDER — KETOROLAC TROMETHAMINE 15 MG/ML IJ SOLN
15.0000 mg | Freq: Once | INTRAMUSCULAR | Status: AC
  Administered 2021-01-01: 15 mg via INTRAMUSCULAR
  Filled 2021-01-01: qty 1
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
## 2021-01-01 NOTE — ED Notes (Signed)
Pt d/c home per MD order. Discharge summary reviewed, pt verbalizes understanding. No s/s of acute distress noted. Ambulatory off unit.

## 2021-01-01 NOTE — ED Notes (Signed)
Pt transported to xray 

## 2021-01-01 NOTE — Discharge Instructions (Addendum)
Return for any problem.   Take Ibuprofen - 600 mg every 8 hours - for pain as instructed.   Avoid use of power tools for next 3-4 days.

## 2021-01-01 NOTE — ED Triage Notes (Signed)
Patient complains of right hand pain that started a few weeks ago.Pain is exacerbated by gripping and spreading fingers. Patient alert, oriented, and in no apparent distress at this time.

## 2021-01-13 ENCOUNTER — Other Ambulatory Visit: Payer: Self-pay | Admitting: Neurology

## 2021-01-13 DIAGNOSIS — R519 Headache, unspecified: Secondary | ICD-10-CM

## 2021-01-13 DIAGNOSIS — G43711 Chronic migraine without aura, intractable, with status migrainosus: Secondary | ICD-10-CM

## 2021-01-13 DIAGNOSIS — R0683 Snoring: Secondary | ICD-10-CM

## 2021-01-13 DIAGNOSIS — G4719 Other hypersomnia: Secondary | ICD-10-CM

## 2021-01-15 NOTE — Telephone Encounter (Signed)
Insurance is wanting the pt to pursue auto CPAP. Order is placed.   Called the pt to discuss CPAP information. There was no answer. LVM asking for the pt to call back. Order was already sent [facility]). We just need to review compliance with the CPAP and schedule for a 60-90 day follow up visit.

## 2021-01-20 ENCOUNTER — Other Ambulatory Visit: Payer: Self-pay

## 2021-01-20 ENCOUNTER — Encounter: Payer: Self-pay | Admitting: Family Medicine

## 2021-01-20 ENCOUNTER — Ambulatory Visit (INDEPENDENT_AMBULATORY_CARE_PROVIDER_SITE_OTHER): Payer: Medicaid Other | Admitting: Family Medicine

## 2021-01-20 VITALS — BP 137/95 | HR 76 | Ht 68.5 in | Wt 194.4 lb

## 2021-01-20 DIAGNOSIS — G4733 Obstructive sleep apnea (adult) (pediatric): Secondary | ICD-10-CM

## 2021-01-20 DIAGNOSIS — G43711 Chronic migraine without aura, intractable, with status migrainosus: Secondary | ICD-10-CM

## 2021-01-20 MED ORDER — UBRELVY 100 MG PO TABS: 100.0000 mg | ORAL_TABLET | ORAL | 11 refills | Status: DC | PRN

## 2021-01-20 NOTE — Patient Instructions (Signed)
Below is our plan:  We will continue Emgality every 30 days. We are going to try to get Bernita Raisin covered since your headaches have decreased in frequency. Try to decrease Excedrin use. I would advise you decrease dose to 2 tablets every other day for 2 weeks then decrease dose to 1 tablet every other day for 2 weeks then stop. Use Ubrelvy as needed to stop a migraine. Please take 1 tablet at onset of headache. May take 1 additional tablet in 2 hours if needed. Do not take more than 2 tablets in 24 hours or more than 10 in a month.   I am going to work with DME to see if we can get you set up with CPAP asap. Please reach out to [facility] if you have not heard back in 1 week.   Please make sure you are staying well hydrated. I recommend 50-60 ounces daily. Well balanced diet and regular exercise encouraged. Consistent sleep schedule with 6-8 hours recommended.   Please continue follow up with care team as directed.   Follow up with me in 3-4 months   You may receive a survey regarding today's visit. I encourage you to leave honest feed back as I do use this information to improve patient care. Thank you for seeing me today!
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
## 2021-01-21 ENCOUNTER — Telehealth: Payer: Self-pay | Admitting: *Deleted

## 2021-01-21 NOTE — Telephone Encounter (Signed)
RE: new cpap pt Received: Today Matthew Ludwig, RN yes she called me on this patient. He has Medicaid and I will need to get a CMN signed so I can submit to [facility] TRACKS to see if they will approve it. We can't schedule him until we get the approval from Medicaid so best case if everything comes back good 2 weeks to be scheduled.  But I am still waiting for that signature. I have emailed Matthew Fowler to get it signed.       Previous Messages    ----- Message -----  From: Matthew Begin, RN  Sent: 01/21/2021  11:18 AM EDT  To: Matthew Fowler  Subject: new cpap pt                                     Hi Matthew Fowler,  spoke to [facility] and she stated that she saw that pt is processed and wanted to make sure that everything is cleared on your end before she call to scheduled.  If cleared then let Matthew Fowler know and she can call pt.  Thanks  (let me know when done.Marland Kitchen  appreciate it.  (Pt Amy NP believes is having regular painful migraines relating to OSA so if could get on cpap , then would not medicate but if was going to be longer time frame then may change plan and give medication).    Pt is Matthew Fowler. [facility]   Male, 50 y.o., 07/07/1971   MRN:  [id number]

## 2021-01-23 NOTE — Progress Notes (Signed)
I have to add that supply chain problems affect the delivery of PAP machines and continue to restrict and delay the distribution of CPAP and BiPAP machines.  He had undergone a sleep study in May and current waiting time is about 3-4 month.  I agree with the assessment and plan as directed by NP on this visit, but a CPAP first visit should not have been scheduled that early - due to the degree of his sleep apnea and co-morbidities, he should be given priority-  Please have sleep lab help you contact Matthew Fowler at [facility] if there has been no timeframe given.    Melvyn Novas, MD

## 2021-03-28 IMAGING — CT CT ANGIO HEAD
1 of 11 series · 5 of 33 positions shown · IV contrast (omnipaque)
Comparison: Head CT 01/02/2019.

CLINICAL DATA: 49-year-old male with dizziness.



[Series 9: cta neck axial · axial · 0.39mm/px · z∈[-308,-61]mm · 5 of 371 slices shown]
[im 62/371  soft-tissue]
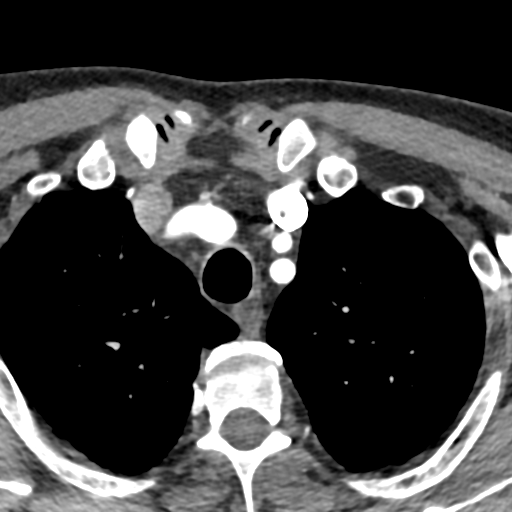
[im 124/371  bone]
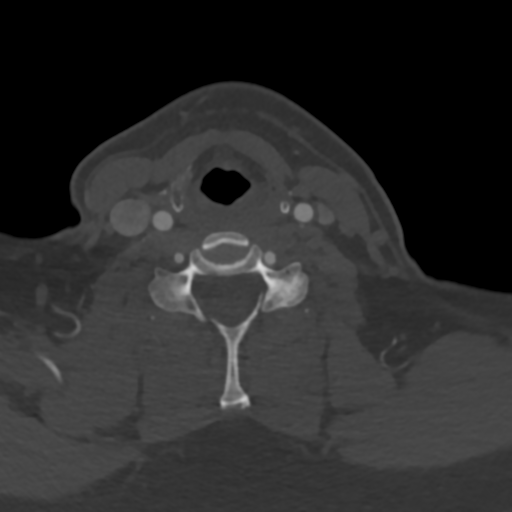
[im 186/371  soft-tissue]
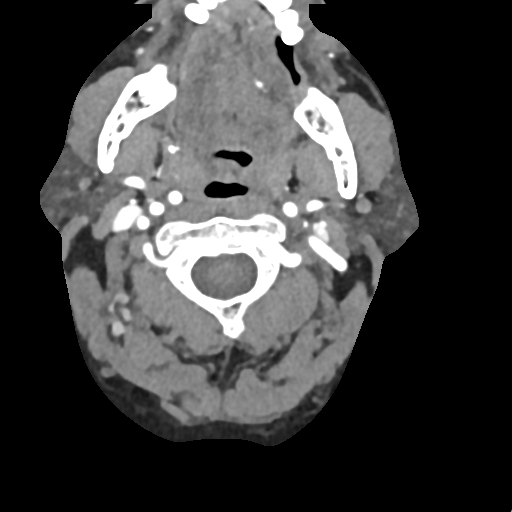
[im 247/371  bone]
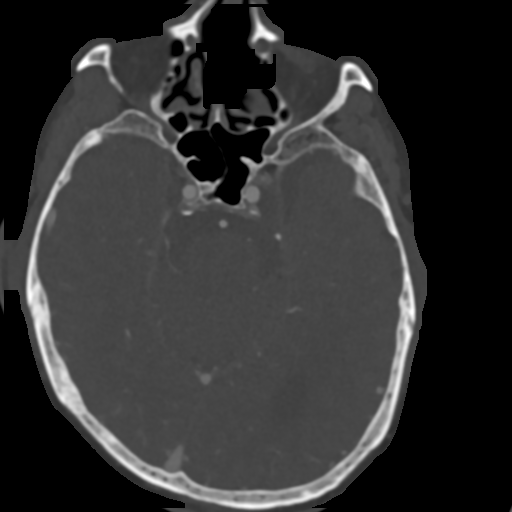
[im 309/371  soft-tissue]
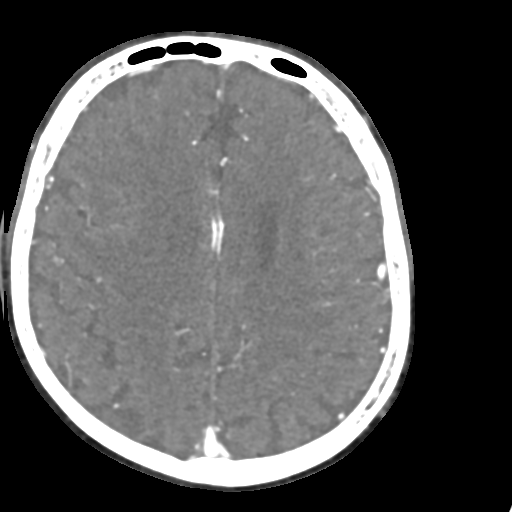

[5 of 33 positions shown; findings below may reference images not displayed]

FINDINGS: CT HEAD

Brain: Stable cerebral volume. Small chronic/congenital left middle
cranial fossa arachnoid cyst suspected (series 3, image 10), stable
and appears inconsequential. No midline shift, ventriculomegaly,
significant mass effect, intracranial hemorrhage or evidence of
cortically based acute infarction.

Stable gray-white matter differentiation throughout the brain,
possible small area of chronic right anterior temporal lobe
encephalomalacia (sagittal image 11). Otherwise minimal nonspecific
white matter hypodensity.

Calvarium and skull base: Stable chronic postoperative changes to
the anterior frontal bone which is severely thinned toward the
vertex as before (series 4, image 65). No acute osseous abnormality
identified.

Paranasal sinuses: Tympanic cavities are clear. Visualized paranasal
sinuses and mastoids are stable and well pneumatized.

Orbits: Chronic postoperative changes to the anterior forehead and
underlying frontal bone are stable since last year. Negative orbits
soft tissues.

CTA NECK

Skeleton: Skull details above. No acute osseous abnormality
identified.

Upper chest: Negative.

Other neck: Negative.

Aortic arch: Bovine type arch configuration. No arch
atherosclerosis.

Right carotid system: Negative right CCA and right carotid
bifurcation. Tortuous cervical right ICA below the skull base
without plaque or stenosis.

Left carotid system: Bovine left CCA origin. Negative aside from
tortuosity similar to the right side.

Vertebral arteries:
Negative.  The left vertebral is mildly dominant.

CTA HEAD

Posterior circulation: Dominant left vertebral V4 segment with
minimal calcified plaque, no stenosis. Normal PICA origins, the
right occurs early. Normal vertebrobasilar junction and basilar
artery. Normal SCA and PCA origins. Mild right P1 tortuosity.
Posterior communicating arteries are diminutive or absent. Bilateral
PCAs are within normal limits.

Anterior circulation: Both ICA siphons are patent. Minimal left
siphon calcified plaque without stenosis. Normal ophthalmic artery
origins. Patent carotid termini.

Right MCA and ACA origins are normal. Right MCA M1 and bifurcation
are patent without stenosis. Right MCA branches are within normal
limits. Anterior communicating artery and bilateral ACA branches are
within normal limits.

However, there is abnormal moderate irregularity and stenosis at the
left ACA origin, associated with mild stenosis of the adjacent left
MCA origin (series 13, image 18). Superimposed mild to moderate
irregularity of the mid left M1 segment also (same image). Left MCA
bifurcation is patent without stenosis, and no other left MCA branch
irregularity is identified.

Venous sinuses: Patent.

Anatomic variants: Dominant left vertebral artery.

Review of the MIP images confirms the above findings
IMPRESSION: 1. Negative for large vessel occlusion or dissection.

2. But positive for up to moderate irregularity and stenosis at the
Left ACA origin, LeftMCA origin, and mid Left M1 segment.
The appearance is nonspecific, but given the relative absence of
atherosclerosis elsewhere as well as carotid tortuosity, consider:
connective tissue disorder, vasculitis, or less likely reversible
vaso-spasm
rather than intracranial atherosclerosis.

3. No other intracranial artery abnormality. Tortuous cervical ICAs.
Minimal atherosclerosis.

4. Stable CT appearance of the brain since 2828. Stable chronic
partial resection of the anterior frontal bones and forehead scalp.
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
## 2021-05-07 NOTE — Patient Instructions (Addendum)
Please continue using your CPAP regularly. While your insurance requires that you use CPAP at least 4 hours each night on 70% of the nights, I recommend, that you not skip any nights and use it throughout the night if you can. Getting used to CPAP and staying with the treatment long term does take time and patience and discipline. Untreated obstructive sleep apnea when it is moderate to severe can have an adverse impact on cardiovascular health and raise her risk for heart disease, arrhythmias, hypertension, congestive heart failure, stroke and diabetes. Untreated obstructive sleep apnea causes sleep disruption, nonrestorative sleep, and sleep deprivation. This can have an impact on your day to day functioning and cause daytime sleepiness and impairment of cognitive function, memory loss, mood disturbance, and problems focussing. Using CPAP regularly can improve these symptoms.   Follow up with me in 4 months

## 2021-05-11 ENCOUNTER — Ambulatory Visit: Payer: Medicaid Other | Admitting: Family Medicine

## 2021-05-11 ENCOUNTER — Encounter: Payer: Self-pay | Admitting: Family Medicine

## 2021-05-11 VITALS — BP 138/95 | HR 79 | Ht 68.5 in | Wt 190.6 lb

## 2021-05-11 DIAGNOSIS — G4733 Obstructive sleep apnea (adult) (pediatric): Secondary | ICD-10-CM

## 2021-05-11 DIAGNOSIS — G43711 Chronic migraine without aura, intractable, with status migrainosus: Secondary | ICD-10-CM

## 2021-09-08 ENCOUNTER — Ambulatory Visit: Payer: Medicaid Other | Admitting: Family Medicine

## 2021-09-15 NOTE — Patient Instructions (Signed)
Below is our plan:  We will continue to monitor headaches. Let me know if you decide to pursue treating your sleep apnea.   Untreated obstructive sleep apnea when it is moderate to severe can have an adverse impact on cardiovascular health and raise her risk for heart disease, arrhythmias, hypertension, congestive heart failure, stroke and diabetes. Untreated obstructive sleep apnea causes sleep disruption, nonrestorative sleep, and sleep deprivation. This can have an impact on your day to day functioning and cause daytime sleepiness and impairment of cognitive function, memory loss, mood disturbance, and problems focussing. Using CPAP regularly can improve these symptoms.  For the drainage, try taking Mucinex twice a day for 7 days. If this doesn't improve see your primary provider.   You can also try using the Zyrtec for allergy symptoms and drainage.   Please make sure you are staying well hydrated. I recommend 50-60 ounces daily. Well balanced diet and regular exercise encouraged. Consistent sleep schedule with 6-8 hours recommended.   Please continue follow up with care team as directed.   Follow up with me in as needed  You may receive a survey regarding today's visit. I encourage you to leave honest feed back as I do use this information to improve patient care. Thank you for seeing me today!
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
## 2021-09-16 ENCOUNTER — Ambulatory Visit: Payer: Medicaid Other | Admitting: Family Medicine

## 2021-09-16 ENCOUNTER — Encounter: Payer: Self-pay | Admitting: Family Medicine

## 2021-09-16 VITALS — BP 124/84 | HR 79 | Ht 68.5 in | Wt 199.0 lb

## 2021-09-16 DIAGNOSIS — H6503 Acute serous otitis media, bilateral: Secondary | ICD-10-CM

## 2021-09-16 DIAGNOSIS — G43711 Chronic migraine without aura, intractable, with status migrainosus: Secondary | ICD-10-CM

## 2021-09-16 DIAGNOSIS — G4733 Obstructive sleep apnea (adult) (pediatric): Secondary | ICD-10-CM | POA: Diagnosis not present
